# Patient Record
Sex: Female | Born: 1980 | Race: White | Hispanic: No | Marital: Single | Smoking: Former smoker
Health system: Southern US, Community
[De-identification: ages and names within clinical notes are randomized; demographics above are authoritative.]

## PROBLEM LIST (undated history)

## (undated) DIAGNOSIS — E079 Disorder of thyroid, unspecified: Secondary | ICD-10-CM

## (undated) DIAGNOSIS — I10 Essential (primary) hypertension: Secondary | ICD-10-CM

## (undated) DIAGNOSIS — D219 Benign neoplasm of connective and other soft tissue, unspecified: Secondary | ICD-10-CM

## (undated) DIAGNOSIS — N1832 Chronic kidney disease, stage 3b: Secondary | ICD-10-CM

## (undated) DIAGNOSIS — Z8619 Personal history of other infectious and parasitic diseases: Secondary | ICD-10-CM

## (undated) HISTORY — DX: Benign neoplasm of connective and other soft tissue, unspecified: D21.9

## (undated) HISTORY — DX: Disorder of thyroid, unspecified: E07.9

## (undated) HISTORY — PX: THYROID LOBECTOMY: SHX420

## (undated) HISTORY — DX: Essential (primary) hypertension: I10

## (undated) HISTORY — PX: OTHER SURGICAL HISTORY: SHX169

## (undated) HISTORY — DX: Personal history of other infectious and parasitic diseases: Z86.19

---

## 2001-03-24 ENCOUNTER — Other Ambulatory Visit: Admission: RE | Admit: 2001-03-24 | Discharge: 2001-03-24 | Payer: Self-pay | Admitting: Gynecology

## 2001-10-13 ENCOUNTER — Inpatient Hospital Stay (HOSPITAL_COMMUNITY): Admission: AD | Admit: 2001-10-13 | Discharge: 2001-10-15 | Payer: Self-pay | Admitting: Gynecology

## 2001-10-13 ENCOUNTER — Encounter (INDEPENDENT_AMBULATORY_CARE_PROVIDER_SITE_OTHER): Payer: Self-pay

## 2005-02-27 ENCOUNTER — Other Ambulatory Visit: Admission: RE | Admit: 2005-02-27 | Discharge: 2005-02-27 | Payer: Self-pay | Admitting: Gynecology

## 2005-11-24 ENCOUNTER — Ambulatory Visit (HOSPITAL_COMMUNITY): Admission: RE | Admit: 2005-11-24 | Discharge: 2005-11-24 | Payer: Self-pay | Admitting: Family Medicine

## 2006-03-02 ENCOUNTER — Other Ambulatory Visit: Admission: RE | Admit: 2006-03-02 | Discharge: 2006-03-02 | Payer: Self-pay | Admitting: Gynecology

## 2007-03-04 ENCOUNTER — Other Ambulatory Visit: Admission: RE | Admit: 2007-03-04 | Discharge: 2007-03-04 | Payer: Self-pay | Admitting: Gynecology

## 2007-03-26 ENCOUNTER — Ambulatory Visit (HOSPITAL_COMMUNITY): Admission: RE | Admit: 2007-03-26 | Discharge: 2007-03-26 | Payer: Self-pay | Admitting: Gynecology

## 2007-05-06 HISTORY — PX: MYOMECTOMY ABDOMINAL APPROACH: SUR870

## 2007-05-11 ENCOUNTER — Encounter: Payer: Self-pay | Admitting: Gynecology

## 2007-05-12 ENCOUNTER — Inpatient Hospital Stay (HOSPITAL_COMMUNITY): Admission: RE | Admit: 2007-05-12 | Discharge: 2007-05-13 | Payer: Self-pay | Admitting: Gynecology

## 2008-03-07 ENCOUNTER — Other Ambulatory Visit: Admission: RE | Admit: 2008-03-07 | Discharge: 2008-03-07 | Payer: Self-pay | Admitting: Gynecology

## 2008-03-07 ENCOUNTER — Encounter: Payer: Self-pay | Admitting: Gynecology

## 2008-03-07 ENCOUNTER — Ambulatory Visit: Payer: Self-pay | Admitting: Gynecology

## 2009-03-20 ENCOUNTER — Encounter: Payer: Self-pay | Admitting: Gynecology

## 2009-03-20 ENCOUNTER — Other Ambulatory Visit: Admission: RE | Admit: 2009-03-20 | Discharge: 2009-03-20 | Payer: Self-pay | Admitting: Gynecology

## 2009-03-20 ENCOUNTER — Ambulatory Visit: Payer: Self-pay | Admitting: Gynecology

## 2009-03-22 ENCOUNTER — Ambulatory Visit (HOSPITAL_COMMUNITY): Admission: RE | Admit: 2009-03-22 | Discharge: 2009-03-22 | Payer: Self-pay | Admitting: Gynecology

## 2009-05-05 DIAGNOSIS — E079 Disorder of thyroid, unspecified: Secondary | ICD-10-CM

## 2009-05-05 HISTORY — DX: Disorder of thyroid, unspecified: E07.9

## 2009-05-15 ENCOUNTER — Other Ambulatory Visit: Admission: RE | Admit: 2009-05-15 | Discharge: 2009-05-15 | Payer: Self-pay | Admitting: Interventional Radiology

## 2009-05-15 ENCOUNTER — Encounter: Admission: RE | Admit: 2009-05-15 | Discharge: 2009-05-15 | Payer: Self-pay | Admitting: Surgery

## 2009-06-18 ENCOUNTER — Ambulatory Visit (HOSPITAL_COMMUNITY): Admission: RE | Admit: 2009-06-18 | Discharge: 2009-06-19 | Payer: Self-pay | Admitting: Surgery

## 2009-06-18 ENCOUNTER — Encounter (INDEPENDENT_AMBULATORY_CARE_PROVIDER_SITE_OTHER): Payer: Self-pay | Admitting: Surgery

## 2010-03-21 ENCOUNTER — Ambulatory Visit: Payer: Self-pay | Admitting: Gynecology

## 2010-03-21 ENCOUNTER — Other Ambulatory Visit: Admission: RE | Admit: 2010-03-21 | Discharge: 2010-03-21 | Payer: Self-pay | Admitting: Gynecology

## 2010-07-25 LAB — URINALYSIS, ROUTINE W REFLEX MICROSCOPIC
Glucose, UA: NEGATIVE mg/dL
Hgb urine dipstick: NEGATIVE
Ketones, ur: NEGATIVE mg/dL
Nitrite: NEGATIVE
pH: 6.5 (ref 5.0–8.0)

## 2010-07-25 LAB — CBC
Hemoglobin: 12.3 g/dL (ref 12.0–15.0)
MCHC: 34.3 g/dL (ref 30.0–36.0)
MCV: 83.1 fL (ref 78.0–100.0)
WBC: 9.4 10*3/uL (ref 4.0–10.5)

## 2010-07-25 LAB — BASIC METABOLIC PANEL
CO2: 30 mEq/L (ref 19–32)
Glucose, Bld: 91 mg/dL (ref 70–99)
Sodium: 137 mEq/L (ref 135–145)

## 2010-07-25 LAB — PREGNANCY, URINE: Preg Test, Ur: NEGATIVE

## 2010-07-25 LAB — PROTIME-INR: Prothrombin Time: 13.5 seconds (ref 11.6–15.2)

## 2010-07-25 LAB — DIFFERENTIAL
Basophils Relative: 2 % — ABNORMAL HIGH (ref 0–1)
Eosinophils Absolute: 0.1 10*3/uL (ref 0.0–0.7)
Eosinophils Relative: 1 % (ref 0–5)
Lymphocytes Relative: 23 % (ref 12–46)
Monocytes Absolute: 0.3 10*3/uL (ref 0.1–1.0)
Monocytes Relative: 3 % (ref 3–12)
Neutrophils Relative %: 72 % (ref 43–77)

## 2010-09-17 NOTE — Discharge Summary (Signed)
Tanya Perkins, SCHORR           ACCOUNT NO.:  [id number]   MEDICAL RECORD NO.:  [id number]          PATIENT TYPE:  INP   LOCATION:  9307                          FACILITY:  [facility]   PHYSICIAN:  Timothy P. Fontaine, M.D.DATE OF BIRTH:  1980/09/01   DATE OF ADMISSION:  05/11/2007  DATE OF DISCHARGE:  05/13/2007                               DISCHARGE SUMMARY   DISCHARGE DIAGNOSES:  1. Menorrhagia.  2. Dysmenorrhea.  3. Pelvic pressure.  4. Leiomyoma.   PROCEDURE:  Exploratory laparotomy, abdominal myomectomy May 11, 2007.   PATHOLOGY:  [id number] leiomyoma, weight 478 grams.   HOSPITAL COURSE:  This is a 30 year old female with increasing  menorrhagia and dysmenorrhea, pelvic pain, known large myoma underwent  exploratory laparotomy, abdominal myomectomy May 11, 2007.  The  patient's postoperative course was uncomplicated.  She was discharged on  postoperative day #2, ambulating well, tolerating a regular diet,  voiding without difficulty, passing flatus with a postoperative  hemoglobin of 8.  The patient received precautions, instructions and  follow-up. Will be seen in the office 2 weeks after discharge and was  given a prescription for Tylox #30 one to two p.o. every 4-6 hours  p.r.n. pain.      Timothy P. Fontaine, M.D.  Electronically Signed     TPF/MEDQ  D:  05/13/2007  T:  05/13/2007  Job:  [id number]

## 2010-09-17 NOTE — Op Note (Signed)
[name], Tanya Perkins           ACCOUNT NO.:  [id number]   MEDICAL RECORD NO.:  [id number]          PATIENT TYPE:  AMB   LOCATION:  SDC                           [facility]ILITY:  [facility]   PHYSICIAN:  Timothy P. Fontaine, M.D.DATE OF BIRTH:  [birth year]   DATE OF PROCEDURE:  05/11/2007  DATE OF DISCHARGE:                               OPERATIVE REPORT   PREOPERATIVE DIAGNOSES:  1. Leiomyoma.  2. Menorrhagia.  3. Dysmenorrhea.  4. Pelvic pain.   POSTOPERATIVE DIAGNOSES:  1. Leiomyoma.  2. Menorrhagia.  3. Dysmenorrhea.  4. Pelvic pain.   PROCEDURE:  Exploratory laparotomy, myomectomy, chromopertubation.   SURGEON:  Timothy P. Fontaine, M.D.   ASSISTANT:  Rande Brunt. Eda Paschal, M.D.   ANESTHETIC:  General.   COMPLICATIONS:  None.   ESTIMATED BLOOD LOSS:  Less than 100 mL.   SPECIMEN:  Leiomyoma, fresh weight 481 g.   FINDINGS:  EUA:  External BUS vagina grossly normal.  Cervix normal.  Bimanual uterus bulky 16-18 weeks size.  Adnexa without gross masses.  Surgical:  Uterus enlarged with single large myoma posterior uterine  surface.  Pelvic anatomy otherwise noted to be normal with normal  fallopian tubes bilaterally, normal ovaries.  No evidence of pelvic  adhesions or endometriosis.  At the end of the procedure, there was  positive fill and spill from both fallopian tubes.   PROCEDURE:  The patient was taken to the operating room, underwent  general endotracheal anesthesia, was placed in the frog-leg position,  received abdominal perineal vaginal preparation with Betadine solution  per nursing personnel.  The indwelling Foley catheter was placed in  sterile technique.  EUA performed.  Subsequently, a speculum was placed.  Anterior lip of the cervix grasped with a single-tooth tenaculum, and a  pediatric Foley catheter was placed within the endometrial cavity for  the chromopertubation study.  The patient was then placed in the supine  position, draped in the usual fashion  and the abdomen was sharply  entered through a Pfannenstiel incision achieving adequate hemostasis at  all levels.  The uterus was exteriorized with exposure of the large  posterior myoma.  Using a dilution of 20 units per 50 mL of saline  vasopressin, the myoma and surrounding myometrium were injected; a total  of 15 mL used.  Subsequently, a sharp incision was made in the uterine  serosa overlying the myoma, and through sharp and blunt dissection, the  myomectomy was performed.  It was noted the myoma was adjacent to the  endometrial cavity and the bulb from the Foley pediatric catheter could  easily be palpated through the thin overlying endometrium.  The uterus  was closed manually, held in place and the chromopertubation was  performed.  There was easy fill and spill of the right fallopian tube.  There was gradual fill and ultimately spill from the left fallopian  tube.  Several interrupted figure-of-eight 0 Vicryl sutures were placed  in the peri-endometrial area to provide endometrial support, and  subsequently, the balloon was deflated, and the endometrial balloon was  removed.  The myometrium was then closed in progressive figure-of-eight  0 Vicryl suture  layers until the entire wall of the myometrium was  reapproximated to the level of the serosa.  The serosa was then  reapproximated using 3-0 PDS in a running stitch burying the suture  line.  The uterus was irrigated.  There was adequate hemostasis.  Uterus  returned to the abdomen, again irrigated, and hemostasis visualized, and  the anterior peritoneum was then reapproximated using 2-0 Vicryl in a  running stitch.  The fascia was then reapproximated using 0 Vicryl in a  running stitch starting at the angle, meeting in the middle.  The  subcutaneous tissues were irrigated.  Adequate hemostasis achieved with  electrocautery, and the skin was reapproximated using 4-0 Vicryl in a  running subcuticular stitch.  Benzoin and  Steri-Strips applied.  Pressure dressing applied.  The patient awakened without difficulty and  taken to the recovery room in good condition, having tolerated the  procedure well with free-flowing clear yellow urine.      Timothy P. Fontaine, M.D.  Electronically Signed     TPF/MEDQ  D:  05/11/2007  T:  05/11/2007  Job:  [id number]

## 2010-09-17 NOTE — H&P (Signed)
Tanya Perkins, GOEHRING           ACCOUNT NO.:  [id number]   MEDICAL RECORD NO.:  [id number]          PATIENT TYPE:  AMB   LOCATION:  SDC                           [facility]ILITY:  [facility]   PHYSICIAN:  Timothy P. Fontaine, M.D.DATE OF BIRTH:  [birth year]   DATE OF ADMISSION:  05/11/2007  DATE OF DISCHARGE:                              HISTORY & PHYSICAL   CHIEF COMPLAINT:  Menorrhagia, dysmenorrhea, pelvic pressure and pain,  leiomyomata.   HISTORY OF PRESENT ILLNESS:  A 30 year old, G1, P12 female with a history  of increasing menorrhagia, increased pelvic pressure, dysmenorrhea with  known enlarging leiomyoma. The last ultrasound showed a 14 cm posterior  myoma displacing the endometrial cavity.  She had a HSG preoperatively  which showed non opacification of the left fallopian tube, right  fallopian tube patent. She is admitted at this time for myomectomy.   PAST MEDICAL HISTORY:  Uncomplicated.   PAST SURGICAL HISTORY:  None.   ALLERGIES:  None.   CURRENT MEDICATIONS:  None.   REVIEW OF SYSTEMS:  Noncontributory.   FAMILY HISTORY:  Noncontributory.   SOCIAL HISTORY:  Noncontributory.   ADMISSION PHYSICAL EXAM:  VITAL SIGNS:  Afebrile, vital signs are  stable.  HEENT: Normal.  LUNGS:  Clear.  CARDIAC:  Regular rate without rubs, murmurs or gallops.  ABDOMEN:  Benign with palpable mass above the symphysis.  PELVIC:  External BUS, vagina normal.  Cervix normal.  Uterus enlarged,  approximately 18-week size, midline, mobile.  Adnexa without gross  masses.   ASSESSMENT:  A 30 year old, G1, P69 female currently not sexually active  with increasing myoma causing increasing bleeding, pelvic pressure and  pain for abdominal myomectomy.  The proposed surgery was reviewed with  her. Alternatives to include continued expectant management, uterine  artery embolization. Surgeries such as exploratory surgery with  myomectomy versus robotic laparoscopic attempted myomectomy were  reviewed  and given the significance of her symptoms she wants to proceed  with myomectomy and wants an open myomectomy, rejects attempt at  robotics.  Preoperative HSG does show blockage of the left fallopian  tube. She understands that this may or may not unblock once removing the  myoma and no guarantees as far as fertility following the procedure was  made.  She understands due to be healing and scarring there may be  blockage of both fallopian tubes following the procedure or endometrial  cavity defects that would make pregnancy difficult.  The patient also  understands that she will require a cesarean delivery for all subsequent  pregnancies given the size of the myoma and the size of the closure and  that we will further discuss this following the procedure but at least  preoperatively my recommendation would be to proceed with cesarean  delivery for resection.  She also understands she will be at increased  risk for uterine rupture during pregnancies due to scarring on the  uterus and expansion of the uterus all of which she understands and  accepts.  The potential for hysterectomy during the procedure if  complications arise leaving her absolutely irreversibly sterile was also  reviewed, understood and accepted.  The  patient has put 2 units of  autologous blood available. She understands that we may exceed  autologous blood and that we may use heterologous blood and the risks of  transfusion were reviewed to include transfusion reaction, hepatitis,  HIV, mad cow disease and other unknown entities.  The acute risks of the  surgery were reviewed with the patient to include the risks of infection  requiring prolonged antibiotics, abscess formation requiring reoperation  abscess drainage, wound complications requiring opening and draining of  incisions, closure by secondary intention, long-term issues with wounds  to include hernia formation was all discussed, understood and accepted.  The risk  of inadvertent injury to internal organs including bowel,  bladder, ureters, vessels and nerves necessitating major exploratory  reparative surgeries, future reparative surgeries, ostomy formation,  bowel resection was all discussed, understood and accepted.  The patient  also understands that we removed a large myoma that was seen on  ultrasound and that if there are any others that are clearly visible  that we will remove these also but there are no guarantees at removing  all her myomas, that she may have persistence of myomas and/or  development of new myomas following the procedure and that she  understands there are no guarantees as far as future myoma growth and  the need for reoperation or treatment.  The patient's questions were  answered to her satisfaction.  She is ready to proceed with surgery.      Timothy P. Fontaine, M.D.  Electronically Signed     TPF/MEDQ  D:  05/05/2007  T:  05/05/2007  Job:  [id number]

## 2010-09-20 NOTE — Discharge Summary (Signed)
[facility]  Patient:    Tanya Perkins, Tanya Perkins Visit Number: [id number] MRN: [id number]          Service Type: OBS Location: 910A 9133 01 Attending Physician:  Douglass Rivers Dictated by:   Antony Contras, [facility] Admit Date:  10/13/2001 Discharge Date: 10/15/2001                             Discharge Summary  DISCHARGE DIAGNOSES:          1. Post-dates pregnancy.                               2. Oligohydramnios.                               3. Left renal hydronephrosis, grade 2, fetal.                               4. Favorable cervix.  PROCEDURES:                   1. Induction of labor.                               2. Normal spontaneous vaginal delivery of viable                                  infant over intact perineum with repair of                                  second degree laceration.  HISTORY OF PRESENT ILLNESS:   The patient is a 30 year old primigravida with an LMP of January 01, 2001, with an [facility] of October 08, 2001.  Prenatal course was complicated by family history of a sibling dying from congenital heart defect. The patient did have an obstetrical fetal echocardiogram in January 2003, which revealed a normal-appearing heart, no abnormality.  The patient also was treated for bacterial vaginosis during the pregnancy.  Otherwise had a normal pregnancy.  PRENATAL LABORATORY DATA:     Blood type B positive, antibody screen negative. RPR, HBsAG, HIV nonreactive.  GBS was negative.  HOSPITAL COURSE:              The patient was admitted for induction of labor on October 12, 2001, secondary to presenting to the office at 40-4/7 weeks.  At that point she had an AFI which showed decreased amniotic fluid of 6.9 cm, at the 5th percentile, also a fetal left renal hydronephrosis grade 2 was noted. Cervix was favorable at the time of admission.  She did progress to complete dilatation, delivered spontaneously an Apgar 8 and 71 female infant that  weighed 8 pounds 4 ounces over an intact perineum with repair of second degree laceration.  Postpartum course:  She remained afebrile, had no difficulty voiding, was able to be discharged in satisfactory condition on her second postpartum day.  CBC:  Hematocrit 29, hemoglobin 9.5, WBC 17.8, platelets 263.  DISPOSITION:                  Follow  up in six weeks.  Continue prenatal vitamins and iron, Motrin and Tylox for pain. Dictated by:   Antony Contras, [facility] Attending Physician:  Douglass Rivers DD:  11/08/01 TD:  11/10/01 Job: [id number]

## 2010-09-20 NOTE — H&P (Signed)
[facility]  Patient:    Tanya Perkins, Tanya Perkins Visit Number: [id number] MRN: [id number]          Service Type: OBS Location: 910B 9162 01 Attending Physician:  Douglass Rivers Dictated by:   Gaetano Hawthorne. Lily Peer, M.D. Admit Date:  10/13/2001                           History and Physical  CHIEF COMPLAINT:              1. Postdate pregnancy.                               2. Oligohydramnios.                               3. Left renal hydronephrosis, grade 2 (fetal).                               4. Favorable cervix.  HISTORY OF PRESENT ILLNESS:   The patient is a 30 year old, gravida 1, para 0, with an estimated date of confinement of October 08, 2001, currently for 40-4/[redacted] weeks gestation, presented to the office on October 12, 2001, for a routine prenatal visit but, due to the fact that she was going to be postdate, an ultrasound for an AFI and NST was done.  The patients AFI demonstrated decreased amniotic fluid with 6.9 cm at the 5th percentile for 40 weeks. Estimated fetal weight was 7 pounds 14 ounces.  Of note, it was evident that there was a fetal left renal hydronephrosis, grade 2.  The patient had an early scan along with a fetal echocardiogram due to the fact that Arlette had a family history of a sibling dying from congenital heart defect.  The precise type of defect was not known.  The obstetrical and fetal echocardiogram was obtained at [facility] in January of this year, and fetal echocardiogram revealed there was normal-appearing heart, and no other abnormality was noted.  Also, during her prenatal course, she was treated for bacterial vaginosis early in her first trimester, and her GBS culture was negative.  Otherwise, she has done well throughout her pregnancy, and her cervix today in the office was 1-2 cm, 80% effaced, -3 station.  PAST MEDICAL HISTORY:         The patient denies any allergies.  Her brother died at the age of 3 from  congenital cardiac valve abnormality.  She denies any smoking or alcohol consumption.  REVIEW OF SYSTEMS:            See [employer] form.  PHYSICAL EXAMINATION:  VITAL SIGNS:                  Blood pressure 118/70.  Urine negative for protein and glucose.  Weight 162 pounds.  HEENT:                        Unremarkable.  NECK:                         Supple.  Trachea midline.  No carotid bruits, no thyromegaly.  LUNGS:  Clear to auscultation without rhonchi or wheezes.  HEART:                        Regular rate and rhythm without any murmur or gallop.  BREAST:                       Not done.  ABDOMEN:                      Gravid.  Uterus fundal height 37.5 cm.  Vertex presentation by Thayer Ohm maneuver as well as by ultrasound today in the office on October 12, 2001.  PELVIC:                       Cervix 1-2 cm, 80% effaced, -3 station.  EXTREMITIES:                  DTR 1+, negative clonus, trace edema.  PRENATAL LABORATORY DATA:     Blood type was B positive, negative antibody screen. PTR was nonreactive.  Rubella immune.  Hepatitis B surface antigen and HIV were negative.  Pasteur was normal.  Alpha-fetoprotein was normal.  Flush urine screen was normal.  Group B Strep culture was negative.  ASSESSMENT:                   17. A 30 year old, gravida 1, para 0, at 40-4/[redacted]                                  weeks gestation is being admitted for                                  induction secondary to favorable cervix.                               2. Postdate pregnancy.                               3. Oligohydramnios.                               4. Fetal left renal hydronephrosis, grade 2.                               5. The patient will be started on Pitocin and                                  followed by rupture of membranes and                                  placement of scalp electrode into uterine                                  pressure catheter  for close fetal  monitorization.  All of this was explained to                                  the patient.  All questions were answered,                                  and we will follow accordingly.  Of note, her                                  GBS culture was negative  PLAN:                         As per assessment above. Dictated by:   Gaetano Hawthorne Lily Peer, M.D. Attending Physician:  Douglass Rivers DD:  10/12/01 TD:  10/12/01 Job: [id number]

## 2010-12-26 ENCOUNTER — Ambulatory Visit (INDEPENDENT_AMBULATORY_CARE_PROVIDER_SITE_OTHER): Payer: Federal, State, Local not specified - PPO | Admitting: Women's Health

## 2010-12-26 ENCOUNTER — Encounter: Payer: Self-pay | Admitting: Women's Health

## 2010-12-26 VITALS — BP 130/70

## 2010-12-26 DIAGNOSIS — Z113 Encounter for screening for infections with a predominantly sexual mode of transmission: Secondary | ICD-10-CM

## 2010-12-26 DIAGNOSIS — R110 Nausea: Secondary | ICD-10-CM

## 2010-12-26 DIAGNOSIS — E049 Nontoxic goiter, unspecified: Secondary | ICD-10-CM | POA: Insufficient documentation

## 2010-12-26 NOTE — Progress Notes (Signed)
  Presents with a complaint of nausea, and is worried about  pregnancy she had unprotected intercourse  x1 on August 2. She had been on the Ortho Evra patch but stopped it, she has not been sexually active. U PT today is negative. Did review starting back on the Ortho Evra patch. Reviewed slight risk for blood clots strokes she is aware she does have an annual in November and does have a current prescription. We'll start back with her next cycle, did review first month it is not protective, will use condoms. States has been under increased stress lately due to her job. She is applying for a new position, she's currently working nights and not getting adequate rest.  External genitalia is within normal limits, speculum exam cervix is pink without lesion or discharge, GC Chlamydia culture was taken and is pending. Bimanual no CMT or adnexal fullness or tenderness. Declines need for HIV hepatitis and RPR.

## 2011-01-22 LAB — CBC
MCHC: 34.1
MCV: 82.1
MCV: 82.9
Platelets: 481 — ABNORMAL HIGH
RBC: 2.9 — ABNORMAL LOW
RDW: 13.3
RDW: 13.6
WBC: 13.2 — ABNORMAL HIGH

## 2011-01-22 LAB — TYPE AND SCREEN: Antibody Screen: NEGATIVE

## 2011-01-22 LAB — HCG, SERUM, QUALITATIVE: Preg, Serum: NEGATIVE

## 2011-05-16 ENCOUNTER — Encounter: Payer: Self-pay | Admitting: Gynecology

## 2011-05-16 ENCOUNTER — Ambulatory Visit (INDEPENDENT_AMBULATORY_CARE_PROVIDER_SITE_OTHER): Payer: Federal, State, Local not specified - PPO | Admitting: Gynecology

## 2011-05-16 VITALS — BP 120/76 | Ht 63.0 in | Wt 176.0 lb

## 2011-05-16 DIAGNOSIS — N926 Irregular menstruation, unspecified: Secondary | ICD-10-CM

## 2011-05-16 DIAGNOSIS — Z01419 Encounter for gynecological examination (general) (routine) without abnormal findings: Secondary | ICD-10-CM

## 2011-05-16 DIAGNOSIS — Z13220 Encounter for screening for lipoid disorders: Secondary | ICD-10-CM

## 2011-05-16 DIAGNOSIS — Z131 Encounter for screening for diabetes mellitus: Secondary | ICD-10-CM

## 2011-05-16 LAB — URINALYSIS W MICROSCOPIC + REFLEX CULTURE
Glucose, UA: NEGATIVE mg/dL
Hgb urine dipstick: NEGATIVE
Ketones, ur: NEGATIVE mg/dL
Leukocytes, UA: NEGATIVE
Urobilinogen, UA: 0.2 mg/dL (ref 0.0–1.0)
pH: 7.5 (ref 5.0–8.0)

## 2011-05-16 MED ORDER — NORELGESTROMIN-ETH ESTRADIOL 150-35 MCG/24HR TD PTWK: 1.0000 | MEDICATED_PATCH | TRANSDERMAL | Status: DC

## 2011-05-16 NOTE — Patient Instructions (Signed)
Follow up for hormone studies. If normal then we'll plan on progesterone withdrawal to bring on menses and then you will start Ortho Evra patches. If any of the labs are abnormal but will discuss this as to where to go from there.

## 2011-05-16 NOTE — Progress Notes (Signed)
Tanya Perkins 06-10-1980 [id number]        31 y.o.  for annual exam.  Had been on Ortho Evra patch doing well but then stopped in November when she ran out, had her period then and has not had a period since then. No other symptoms. She does want to restart the Ortho Evra patch.  Past medical history,surgical history, medications, allergies, family history and social history were all reviewed and documented in the EPIC chart. ROS:  Was performed and pertinent positives and negatives are included in the history.  Exam: Sherrilyn Rist chaperone present Filed Vitals:   05/16/11 1018  BP: 120/76   General appearance  Normal Skin grossly normal Head/Neck normal with no cervical or supraclavicular adenopathy thyroid normal Lungs  clear Cardiac RR, without RMG Abdominal  soft, nontender, without masses, organomegaly or hernia Breasts  examined lying and sitting without masses, retractions, discharge or axillary adenopathy. Pelvic  Ext/BUS/vagina  normal   Cervix  normal    Uterus  anteverted, normal size, shape and contour, midline and mobile nontender   Adnexa  Without masses or tenderness    Anus and perineum  normal   Rectovaginal  normal sphincter tone without palpated masses or tenderness.    Assessment/Plan:  31 y.o. female for annual exam.    1. Missed menses. Discussed various possibilities. Will check baseline labs to include qualitative hCG, TSH, FSH, prolactin. Patient will follow up for results. Assuming negative we'll plan Provera withdrawal 10 mg twice a day x5 days and then restart Ortho Evra at her choice. 2. Contraception. I refilled her Ortho Evra times a year. She does know to wait until she hears from [place] about the labs and then we'll plan on the progesterone withdrawal and then restarting. 3. Breast self SBE monthly reviewed plan mammogram closer to 40. 4. Pap smear. No Pap smear was done today. She has no history of abnormal Pap smears in the past with numerous normal reports  in her chart. Her last Pap smear was 2011. I discussed current screening guidelines we'll plan on an every 3 year screen. 5. Health maintenance. Will check baseline CBC glucose lipid profile and urinalysis along with her above hormone studies.    Dara Lords MD, 10:41 AM 05/16/2011

## 2011-05-17 LAB — FOLLICLE STIMULATING HORMONE: FSH: 5 m[IU]/mL

## 2011-05-17 LAB — CBC WITH DIFFERENTIAL/PLATELET
Basophils Relative: 0 % (ref 0–1)
Eosinophils Absolute: 0.1 10*3/uL (ref 0.0–0.7)
Eosinophils Relative: 1 % (ref 0–5)
Lymphs Abs: 1.9 10*3/uL (ref 0.7–4.0)
MCH: 25.3 pg — ABNORMAL LOW (ref 26.0–34.0)
Monocytes Absolute: 0.3 10*3/uL (ref 0.1–1.0)
Monocytes Relative: 4 % (ref 3–12)
Neutro Abs: 5.3 10*3/uL (ref 1.7–7.7)
Platelets: 414 10*3/uL — ABNORMAL HIGH (ref 150–400)
RBC: 3.99 MIL/uL (ref 3.87–5.11)

## 2011-05-17 LAB — PROLACTIN: Prolactin: 8.4 ng/mL

## 2011-05-17 LAB — GLUCOSE, RANDOM: Glucose, Bld: 76 mg/dL (ref 70–99)

## 2011-05-17 LAB — LIPID PANEL
LDL Cholesterol: 73 mg/dL (ref 0–99)
Total CHOL/HDL Ratio: 2.5 Ratio
Triglycerides: 67 mg/dL (ref <150)
VLDL: 13 mg/dL (ref 0–40)

## 2011-05-19 ENCOUNTER — Other Ambulatory Visit: Payer: Self-pay | Admitting: *Deleted

## 2011-05-19 DIAGNOSIS — D649 Anemia, unspecified: Secondary | ICD-10-CM

## 2011-05-19 MED ORDER — MEDROXYPROGESTERONE ACETATE 10 MG PO TABS: 10.0000 mg | ORAL_TABLET | Freq: Two times a day (BID) | ORAL | Status: DC

## 2011-05-19 NOTE — Progress Notes (Signed)
Addended by: Dara Lords on: 05/19/2011 08:07 AM   Modules accepted: Orders

## 2011-06-02 ENCOUNTER — Telehealth: Payer: Self-pay | Admitting: *Deleted

## 2011-06-02 ENCOUNTER — Other Ambulatory Visit: Payer: Federal, State, Local not specified - PPO

## 2011-06-02 DIAGNOSIS — N912 Amenorrhea, unspecified: Secondary | ICD-10-CM

## 2011-06-02 DIAGNOSIS — D649 Anemia, unspecified: Secondary | ICD-10-CM

## 2011-06-02 LAB — CBC WITH DIFFERENTIAL/PLATELET
Eosinophils Relative: 1 % (ref 0–5)
HCT: 34.1 % — ABNORMAL LOW (ref 36.0–46.0)
Lymphocytes Relative: 24 % (ref 12–46)
Lymphs Abs: 2.4 10*3/uL (ref 0.7–4.0)
MCV: 81 fL (ref 78.0–100.0)
Neutro Abs: 7 10*3/uL (ref 1.7–7.7)
Platelets: 492 10*3/uL — ABNORMAL HIGH (ref 150–400)
RBC: 4.21 MIL/uL (ref 3.87–5.11)
WBC: 9.9 10*3/uL (ref 4.0–10.5)

## 2011-06-02 NOTE — Telephone Encounter (Signed)
Pt informed with the below note, transferred to appointment desk 

## 2011-06-02 NOTE — Telephone Encounter (Signed)
Lm for pt to call

## 2011-06-02 NOTE — Telephone Encounter (Signed)
Recheck hCG. Order was placed. Assuming negative then we'll plan on starting Ortho Evra as she may require some estrogen to have a period

## 2011-06-02 NOTE — Telephone Encounter (Signed)
Pt had office visit on 1//11/13 for no period in December. Pt was given provera 10 mg twice a day for 5 days and was told to start patch ortho evra once period starts. Pt said no period yet? Pt would like to know what should she do? Please advise

## 2011-06-03 ENCOUNTER — Other Ambulatory Visit: Payer: Self-pay | Admitting: *Deleted

## 2011-06-03 DIAGNOSIS — D649 Anemia, unspecified: Secondary | ICD-10-CM

## 2012-05-28 ENCOUNTER — Encounter: Payer: Federal, State, Local not specified - PPO | Admitting: Gynecology

## 2012-06-18 ENCOUNTER — Encounter: Payer: Federal, State, Local not specified - PPO | Admitting: Gynecology

## 2012-08-24 ENCOUNTER — Encounter: Payer: Self-pay | Admitting: Gynecology

## 2012-09-02 ENCOUNTER — Encounter: Payer: Self-pay | Admitting: Women's Health

## 2012-09-02 ENCOUNTER — Other Ambulatory Visit (HOSPITAL_COMMUNITY)
Admission: RE | Admit: 2012-09-02 | Discharge: 2012-09-02 | Disposition: A | Discharge status: Home / Self Care | Payer: Federal, State, Local not specified - PPO | Source: Ambulatory Visit | Attending: Gynecology | Admitting: Gynecology

## 2012-09-02 ENCOUNTER — Encounter: Payer: Self-pay | Admitting: Gynecology

## 2012-09-02 ENCOUNTER — Ambulatory Visit (INDEPENDENT_AMBULATORY_CARE_PROVIDER_SITE_OTHER): Payer: Federal, State, Local not specified - PPO | Admitting: Gynecology

## 2012-09-02 VITALS — BP 120/76 | Ht 64.0 in | Wt 186.0 lb

## 2012-09-02 DIAGNOSIS — Z1151 Encounter for screening for human papillomavirus (HPV): Secondary | ICD-10-CM | POA: Insufficient documentation

## 2012-09-02 DIAGNOSIS — Z13220 Encounter for screening for lipoid disorders: Secondary | ICD-10-CM

## 2012-09-02 DIAGNOSIS — Z01419 Encounter for gynecological examination (general) (routine) without abnormal findings: Secondary | ICD-10-CM | POA: Insufficient documentation

## 2012-09-02 DIAGNOSIS — R635 Abnormal weight gain: Secondary | ICD-10-CM

## 2012-09-02 LAB — CBC WITH DIFFERENTIAL/PLATELET
Hemoglobin: 12.3 g/dL (ref 12.0–15.0)
Lymphocytes Relative: 24 % (ref 12–46)
Lymphs Abs: 2.3 10*3/uL (ref 0.7–4.0)
MCH: 25.9 pg — ABNORMAL LOW (ref 26.0–34.0)
Monocytes Relative: 5 % (ref 3–12)
Neutro Abs: 6.8 10*3/uL (ref 1.7–7.7)
Neutrophils Relative %: 70 % (ref 43–77)
RBC: 4.75 MIL/uL (ref 3.87–5.11)
WBC: 9.6 10*3/uL (ref 4.0–10.5)

## 2012-09-02 LAB — COMPREHENSIVE METABOLIC PANEL
ALT: 19 U/L (ref 0–35)
Albumin: 4.2 g/dL (ref 3.5–5.2)
CO2: 30 mEq/L (ref 19–32)
Chloride: 99 mEq/L (ref 96–112)
Glucose, Bld: 79 mg/dL (ref 70–99)
Potassium: 4.2 mEq/L (ref 3.5–5.3)
Sodium: 135 mEq/L (ref 135–145)
Total Protein: 7.2 g/dL (ref 6.0–8.3)

## 2012-09-02 LAB — LIPID PANEL
HDL: 53 mg/dL (ref >39)
LDL Cholesterol: 82 mg/dL (ref 0–99)
Total CHOL/HDL Ratio: 2.9 Ratio
Triglycerides: 91 mg/dL (ref <150)
VLDL: 18 mg/dL (ref 0–40)

## 2012-09-02 LAB — TSH: TSH: 1.372 u[IU]/mL (ref 0.350–4.500)

## 2012-09-02 NOTE — Addendum Note (Signed)
Addended by: Dayna Barker on: 09/02/2012 11:47 AM   Modules accepted: Orders

## 2012-09-02 NOTE — Progress Notes (Signed)
Tanya Perkins Jun 29, 1980 [id number]        32 y.o.  G1P1001 for annual exam.  Doing well. Does note some weight gain in the past year.   Past medical history,surgical history, medications, allergies, family history and social history were all reviewed and documented in the EPIC chart. ROS:  Was performed and pertinent positives and negatives are included in the history.  Exam: Kim assistant Filed Vitals:   09/02/12 1100  BP: 120/76  Height: 5\' 4"  (1.626 m)  Weight: 186 lb (84.369 kg)   General appearance  Normal Skin grossly normal Head/Neck normal with no cervical or supraclavicular adenopathy thyroid normal Lungs  clear Cardiac RR, without RMG Abdominal  soft, nontender, without masses, organomegaly or hernia Breasts  examined lying and sitting without masses, retractions, discharge or axillary adenopathy. Pelvic  Ext/BUS/vagina  normal   Cervix  normal Pap/HPV  Uterus  anteverted, normal size, shape and contour, midline and mobile nontender   Adnexa  Without masses or tenderness    Anus and perineum  normal   Rectovaginal  normal sphincter tone without palpated masses or tenderness.    Assessment/Plan:  32 y.o. G16P1001 female for annual exam.   1. Contraception. Patient was on Ortho Evra but discontinued this as she is not sexually active. Does not want to be on anything at this point but will call if she wants to restart contraception. 2. Weight gain. I suspect diet/exercise related. Will check baseline TSH comprehensive metabolic panel. 3. Anemia. History of hemoglobin of 10. We'll check CBC today. 4. Breast health. SBE monthly review. We'll plan mammogram closer to 40. 5. Pap smear 2011. Pap/HPV done today. No history of abnormal Pap smears. Plan 5 year screening if normal.   6. Health maintenance. CBC lipid profile TSH urinalysis comprehensive metabolic panel ordered. Followup one year, sooner as needed   Dara Lords MD, 11:37 AM 09/02/2012

## 2012-09-02 NOTE — Patient Instructions (Addendum)
Follow up for annual exam in one year 

## 2012-09-03 LAB — URINALYSIS W MICROSCOPIC + REFLEX CULTURE
Bacteria, UA: NONE SEEN
Bilirubin Urine: NEGATIVE
Crystals: NONE SEEN
Hgb urine dipstick: NEGATIVE
Ketones, ur: NEGATIVE mg/dL
Nitrite: NEGATIVE
Specific Gravity, Urine: 1.019 (ref 1.005–1.030)
Urobilinogen, UA: 0.2 mg/dL (ref 0.0–1.0)

## 2012-12-16 ENCOUNTER — Encounter: Payer: Self-pay | Admitting: *Deleted

## 2012-12-16 NOTE — Progress Notes (Signed)
Patient ID: Tanya Perkins, female   DOB: [birth year], 32 y.o.   MRN: [id number] Victorino Dike from Dr.kerr office called stating pt called requesting referral. I called pt back and left her message regarding this, nonething was in chart regarding this.

## 2012-12-20 ENCOUNTER — Telehealth: Payer: Self-pay | Admitting: *Deleted

## 2012-12-20 NOTE — Telephone Encounter (Signed)
Okay for referral.  Am not exactly sure in reference to what others and I think it may be weight loss.

## 2012-12-20 NOTE — Telephone Encounter (Signed)
Notes faxed to office,they will contact pt with time and date.

## 2012-12-20 NOTE — Telephone Encounter (Signed)
Pt was seen on 09/02/12 for annual, pt said that you told her if she found a endocrinologist to let you know and referral would be given? Patient would like to see Dr. Sharl Ma. Okay to place referral?

## 2013-01-04 NOTE — Telephone Encounter (Signed)
Appt. 02/24/13 @ 12:20 pm with Dr.Kerr. Pt is aware of this time and date.

## 2014-03-06 ENCOUNTER — Encounter: Payer: Self-pay | Admitting: Gynecology

## 2014-07-03 ENCOUNTER — Encounter: Payer: Self-pay | Admitting: Family

## 2014-07-03 ENCOUNTER — Ambulatory Visit (INDEPENDENT_AMBULATORY_CARE_PROVIDER_SITE_OTHER): Payer: Federal, State, Local not specified - PPO | Admitting: Family

## 2014-07-03 VITALS — BP 136/84 | HR 96 | Temp 98.1°F | Resp 18 | Ht 64.0 in | Wt 195.8 lb

## 2014-07-03 DIAGNOSIS — E049 Nontoxic goiter, unspecified: Secondary | ICD-10-CM

## 2014-07-03 NOTE — Assessment & Plan Note (Signed)
R thyroid fullness. Obtain thyroid US to further evaluate. Will plan to check TSH with her upcoming cpx.

## 2014-07-03 NOTE — Progress Notes (Signed)
Subjective:    Patient ID: Tanya Perkins, female    DOB: 30-Nov-1980, 34 y.o.   MRN: [id number]  HPI  [name] is 34 yr old female who presents today to establish care.   Thyroid mass-Pt has hx of non-malignant, partial thyroidectomy. tsh was checked 12/14- wnl per pt- checked by gyn.  Reports TSH has been stable without synthroid.  Notes that her weight can fluctuate up and down.      Review of Systems  Constitutional:       Variable weight  HENT: Negative for hearing loss and rhinorrhea.   Eyes: Negative for visual disturbance.  Respiratory: Negative for cough.   Cardiovascular: Negative for leg swelling.  Gastrointestinal: Negative for diarrhea and constipation.  Genitourinary: Negative for dysuria and frequency.       Some dysmenorrhea  Musculoskeletal: Negative for myalgias and arthralgias.  Skin: Negative for rash.  Neurological:       Reports occasional mild headaches, rarely bad  Hematological: Negative for adenopathy.  Psychiatric/Behavioral:       Denies depression/anxiety       Past Medical History  Diagnosis Date  . 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 Fibroid     uterine  . History of chicken pox     History   Social History  . Marital Status: Single    Spouse Name: N/A  . Number of Children: N/A  . Years of Education: N/A   Occupational History  . Not on file.   Social History Main Topics  . Smoking status: Former Research scientist (life sciences)  . Smokeless tobacco: Never Used  . Alcohol Use: Yes     Comment: Rare  . Drug Use: No  . Sexual Activity:    Partners: Male    Birth Control/ Protection: None   Other Topics Concern  . 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 Myomectomy abdominal approach  05/2007  . Thyroid lobectomy      LEFT      Family History  Problem Relation Age of Onset  . Hypertension Brother   . Hyperlipidemia Brother   . Diabetes Maternal Aunt   . Hypertension Maternal Aunt   . Diabetes Maternal Uncle   . Hypertension Maternal Uncle   . Diabetes Maternal Grandmother   . Stroke Maternal Grandmother   . Breast cancer Paternal Grandmother     Age 45's  . Diabetes Cousin   . Asthma Cousin   . Hypertension Father   . Hyperlipidemia Father   . Hypertension Paternal Aunt   . Hypertension Paternal Uncle   . Stroke Paternal Grandfather   . Hypertension Paternal Grandfather   . Thyroid disease Mother     ?due to accident  . Leukemia Maternal Grandfather   . Cancer Maternal Grandfather     lung  . Hypertension Maternal Grandfather     No Known Allergies  Current Outpatient Prescriptions on File Prior to Visit  Medication Sig Dispense Refill  . Multiple Vitamin (MULTIVITAMIN) tablet Take 1 tablet by mouth daily.     No current facility-administered medications on file prior to visit.    BP 136/84 mmHg  Pulse 96  Temp(Src) 98.1 F (36.7 C) (Oral)  Resp 18  Ht 5\' 4"  (1.626 m)  Wt 195 lb 12.8 oz (88.814 kg)  BMI 33.59 kg/m2  SpO2 100%  LMP 06/13/2014    Objective:   Physical Exam  Constitutional: She is oriented to person, place, and time. She appears well-developed and well-nourished.  HENT:  Right Ear: Tympanic membrane and ear canal normal.  Left Ear: Tympanic membrane and ear canal normal.  Neck:  R thyroid fullness, L thyroid lobe surgically absent.   Cardiovascular: Normal rate, regular rhythm and normal heart sounds.   No murmur heard. Pulmonary/Chest: Effort normal and breath sounds normal. No respiratory distress. She has no wheezes.  Musculoskeletal: She exhibits no edema.  Lymphadenopathy:    She has no cervical adenopathy.  Neurological: She is alert and oriented to person, place, and time.  Skin: Skin is warm and dry.  Psychiatric: She has a normal mood and affect.  Her behavior is normal. Judgment and thought content normal.          Assessment & Plan:

## 2014-07-03 NOTE — Patient Instructions (Signed)
You will be contacted about your thyroid ultrasound. Please schedule fasting physical at the front desk.   Welcome to [facility]!

## 2014-07-03 NOTE — Progress Notes (Signed)
Pre visit review using our clinic review tool, if applicable. No additional management support is needed unless otherwise documented below in the visit note. 

## 2014-07-05 ENCOUNTER — Ambulatory Visit (HOSPITAL_BASED_OUTPATIENT_CLINIC_OR_DEPARTMENT_OTHER)
Admission: RE | Admit: 2014-07-05 | Discharge: 2014-07-05 | Disposition: A | Discharge status: Home / Self Care | Payer: Federal, State, Local not specified - PPO | Source: Ambulatory Visit | Attending: Family | Admitting: Family

## 2014-07-05 DIAGNOSIS — E042 Nontoxic multinodular goiter: Secondary | ICD-10-CM | POA: Insufficient documentation

## 2014-07-05 DIAGNOSIS — E010 Iodine-deficiency related diffuse (endemic) goiter: Secondary | ICD-10-CM | POA: Diagnosis present

## 2014-07-05 DIAGNOSIS — E049 Nontoxic goiter, unspecified: Secondary | ICD-10-CM

## 2014-07-06 ENCOUNTER — Encounter: Payer: Self-pay | Admitting: Family

## 2014-07-20 ENCOUNTER — Telehealth: Payer: Self-pay | Admitting: Family

## 2014-07-20 NOTE — Telephone Encounter (Signed)
Pre visit letter sent  °

## 2014-08-09 ENCOUNTER — Encounter: Payer: Self-pay | Admitting: Family

## 2014-08-09 ENCOUNTER — Ambulatory Visit (INDEPENDENT_AMBULATORY_CARE_PROVIDER_SITE_OTHER): Payer: Federal, State, Local not specified - PPO | Admitting: Family

## 2014-08-09 VITALS — BP 118/80 | HR 81 | Temp 98.2°F | Resp 16 | Ht 64.0 in | Wt 194.0 lb

## 2014-08-09 DIAGNOSIS — Z23 Encounter for immunization: Secondary | ICD-10-CM

## 2014-08-09 DIAGNOSIS — Z0000 Encounter for general adult medical examination without abnormal findings: Secondary | ICD-10-CM | POA: Insufficient documentation

## 2014-08-09 LAB — CBC WITH DIFFERENTIAL/PLATELET
BASOS ABS: 0.1 10*3/uL (ref 0.0–0.1)
Basophils Relative: 1.2 % (ref 0.0–3.0)
EOS ABS: 0.6 10*3/uL (ref 0.0–0.7)
Eosinophils Relative: 0.6 % (ref 0.0–5.0)
HEMATOCRIT: 39.7 % (ref 36.0–46.0)
Hemoglobin: 13.2 g/dL (ref 12.0–15.0)
LYMPHS ABS: 2.3 10*3/uL (ref 0.7–4.0)
Lymphocytes Relative: 21.4 % (ref 12.0–46.0)
MCHC: 33.3 g/dL (ref 30.0–36.0)
MCV: 82.5 fl (ref 78.0–100.0)
MONO ABS: 0.3 10*3/uL (ref 0.1–1.0)
Monocytes Relative: 3.1 % (ref 3.0–12.0)
Neutro Abs: 7.8 10*3/uL — ABNORMAL HIGH (ref 1.4–7.7)
Neutrophils Relative %: 73.7 % (ref 43.0–77.0)
PLATELETS: 426 10*3/uL — AB (ref 150.0–400.0)
RBC: 4.82 Mil/uL (ref 3.87–5.11)
RDW: 14 % (ref 11.5–15.5)
WBC: 10.7 10*3/uL — ABNORMAL HIGH (ref 4.0–10.5)

## 2014-08-09 LAB — BASIC METABOLIC PANEL
BUN: 9 mg/dL (ref 6–23)
CALCIUM: 9.4 mg/dL (ref 8.4–10.5)
CO2: 27 mEq/L (ref 19–32)
Chloride: 100 mEq/L (ref 96–112)
Creatinine, Ser: 0.73 mg/dL (ref 0.40–1.20)
GFR: 96.45 mL/min (ref >60.00)
Glucose, Bld: 83 mg/dL (ref 70–99)
POTASSIUM: 3.5 meq/L (ref 3.5–5.1)
Sodium: 135 mEq/L (ref 135–145)

## 2014-08-09 LAB — URINALYSIS, ROUTINE W REFLEX MICROSCOPIC
Bilirubin Urine: NEGATIVE
Leukocytes, UA: NEGATIVE
Nitrite: NEGATIVE
Specific Gravity, Urine: >=1.03 — AB (ref 1.000–1.030)
TOTAL PROTEIN, URINE-UPE24: NEGATIVE
UROBILINOGEN UA: 0.2 (ref 0.0–1.0)
Urine Glucose: NEGATIVE
pH: 6 (ref 5.0–8.0)

## 2014-08-09 LAB — LIPID PANEL
Cholesterol: 158 mg/dL (ref 0–200)
HDL: 45.8 mg/dL (ref >39.00)
LDL Cholesterol: 99 mg/dL (ref 0–99)
NONHDL: 112.2
Total CHOL/HDL Ratio: 3
Triglycerides: 64 mg/dL (ref 0.0–149.0)
VLDL: 12.8 mg/dL (ref 0.0–40.0)

## 2014-08-09 LAB — HEPATIC FUNCTION PANEL
ALT: 18 U/L (ref 0–35)
AST: 18 U/L (ref 0–37)
Albumin: 4.2 g/dL (ref 3.5–5.2)
Alkaline Phosphatase: 75 U/L (ref 39–117)
BILIRUBIN DIRECT: 0.1 mg/dL (ref 0.0–0.3)
Total Bilirubin: 0.5 mg/dL (ref 0.2–1.2)
Total Protein: 7.9 g/dL (ref 6.0–8.3)

## 2014-08-09 LAB — TSH: TSH: 3.45 u[IU]/mL (ref 0.35–4.50)

## 2014-08-09 NOTE — Assessment & Plan Note (Signed)
Discussed diet, exercise, weight loss. Obtain routine lab work. Pap up to date. tdap today.

## 2014-08-09 NOTE — Progress Notes (Signed)
Pre visit review using our clinic review tool, if applicable. No additional management support is needed unless otherwise documented below in the visit note. 

## 2014-08-09 NOTE — Patient Instructions (Addendum)
Please complete lab work prior to leaving. Follow up in 1 year for annual physical and follow up thyroid ultrasound.  Continue your work with healthy diet, exercise, weight loss.

## 2014-08-09 NOTE — Progress Notes (Signed)
Subjective:    Patient ID: Tanya Perkins, female    DOB: 04/22/1981, 34 y.o.   MRN: [id number]  HPI  Ms. Tanya Perkins is a 34 yr old female who presents today for cpx.  Patient presents today for complete physical.  Immunizations: tetanus up to date Diet: reports healthy diet Exercise: yes- walks/runs 5 miles every other day Pap Smear:  2014- Dr. Phineas Real [facility]YN Dental: January 2016 Eye:  Due last was 1/15   Review of Systems  Constitutional: Negative for unexpected weight change.  HENT: Negative for hearing loss and rhinorrhea.   Eyes: Negative for visual disturbance.  Respiratory: Negative for cough and shortness of breath.   Cardiovascular: Negative for leg swelling.  Gastrointestinal: Negative for nausea, diarrhea and constipation.  Genitourinary: Negative for dysuria, frequency and hematuria.  Musculoskeletal: Negative for myalgias and arthralgias.  Skin: Negative for rash.  Neurological:       Occasional headaches due to allergies and stress  Hematological: Negative for adenopathy.  Psychiatric/Behavioral: Negative for dysphoric mood and agitation.   Past Medical History  Diagnosis Date  . 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 Fibroid     uterine  . History of chicken pox     History   Social History  . Marital Status: Single    Spouse Name: N/A  . Number of Children: N/A  . Years of Education: N/A   Occupational History  . Not on file.   Social History Main Topics  . Smoking status: Former Research scientist (life sciences)  . Smokeless tobacco: Never Used  . Alcohol Use: Yes     Comment: Rare  . Drug Use: No  . Sexual Activity:    Partners: Male    Birth Control/ Protection: None   Other Topics Concern  . 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 Myomectomy abdominal approach  05/2007  . Thyroid lobectomy      LEFT    Family History  Problem Relation Age of Onset  . Hypertension Brother   . Hyperlipidemia Brother   . Diabetes Maternal Aunt   . Hypertension Maternal Aunt   . Diabetes Maternal Uncle   . Hypertension Maternal Uncle   . Diabetes Maternal Grandmother   . Stroke Maternal Grandmother   . Breast cancer Paternal Grandmother     Age 12's  . Diabetes Cousin   . Asthma Cousin   . Hypertension Father   . Hyperlipidemia Father   . Hypertension Paternal Aunt   . Hypertension Paternal Uncle   . Stroke Paternal Grandfather   . Hypertension Paternal Grandfather   . Thyroid disease Mother     ?due to accident  . Leukemia Maternal Grandfather   . Cancer Maternal Grandfather     lung  . Hypertension Maternal Grandfather     No Known Allergies  Current Outpatient Prescriptions on File Prior to Visit  Medication Sig Dispense Refill  . Multiple Vitamin (MULTIVITAMIN) tablet Take 1 tablet by mouth daily.    . Omega-3 Fatty Acids (FISH OIL) 1000 MG CAPS Take 1 capsule by mouth every other day.     No current facility-administered medications on file prior to visit.    BP 118/80 mmHg  Pulse 81  Temp(Src) 98.2  F (36.8 C) (Oral)  Resp 16  Ht 5\' 4"  (1.626 m)  Wt 194 lb (87.998 kg)  BMI 33.28 kg/m2  SpO2 99%  LMP 07/09/2014       Objective:   Physical Exam Physical Exam  Constitutional: She is oriented to person, place, and time. She appears well-developed and well-nourished. No distress.  HENT:  Head: Normocephalic and atraumatic.  Right Ear: Tympanic membrane and ear canal normal.  Left Ear: Tympanic membrane and ear canal normal.  Mouth/Throat: Oropharynx is clear and moist.  Eyes: Pupils are equal, round, and reactive to light. No scleral icterus.  Neck: Normal range of motion. Mild right thyroid fullness noted. l thyroid lobe surgically absent  Cardiovascular: Normal rate and  regular rhythm.   No murmur heard. Pulmonary/Chest: Effort normal and breath sounds normal. No respiratory distress. He has no wheezes. She has no rales. She exhibits no tenderness.  Abdominal: Soft. Bowel sounds are normal. He exhibits no distension and no mass. There is no tenderness. There is no rebound and no guarding.  Musculoskeletal: She exhibits no edema.  Lymphadenopathy:    She has no cervical adenopathy.  Neurological: She is alert and oriented to person, place, and time. She has normal patellar reflexes. She exhibits normal muscle tone. Coordination normal.  Skin: Skin is warm and dry.  Psychiatric: She has a normal mood and affect. Her behavior is normal. Judgment and thought content normal.  Breasts: Examined lying Right: Without masses, retractions, discharge or axillary adenopathy.  Left: Without masses, retractions, discharge or axillary adenopathy.          Assessment & Plan:          Assessment & Plan:

## 2014-08-10 ENCOUNTER — Encounter: Payer: Self-pay | Admitting: Family

## 2015-01-04 ENCOUNTER — Encounter: Payer: Self-pay | Admitting: Family

## 2015-01-04 DIAGNOSIS — Z30011 Encounter for initial prescription of contraceptive pills: Secondary | ICD-10-CM

## 2015-01-04 NOTE — Telephone Encounter (Signed)
Tanya Perkins, We had discussed a birth control method that would help regulate my periods. I can come in for a visit if you prefer again, I would like to go back on the O

## 2015-01-04 NOTE — Telephone Encounter (Signed)
Please advise if you would like me to schedule an office visit to discuss the birth control options?

## 2015-01-05 NOTE — Telephone Encounter (Signed)
Pt has lab appt 01/09/15.

## 2015-01-09 ENCOUNTER — Other Ambulatory Visit (INDEPENDENT_AMBULATORY_CARE_PROVIDER_SITE_OTHER): Payer: Federal, State, Local not specified - PPO

## 2015-01-09 ENCOUNTER — Encounter: Payer: Self-pay | Admitting: Family

## 2015-01-09 DIAGNOSIS — Z30011 Encounter for initial prescription of contraceptive pills: Secondary | ICD-10-CM | POA: Diagnosis not present

## 2015-01-10 ENCOUNTER — Telehealth: Payer: Self-pay | Admitting: Family

## 2015-01-10 ENCOUNTER — Other Ambulatory Visit: Payer: Self-pay | Admitting: Family

## 2015-01-10 LAB — PREGNANCY, URINE: Preg Test, Ur: NEGATIVE

## 2015-01-10 MED ORDER — NORELGESTROMIN-ETH ESTRADIOL 150-35 MCG/24HR TD PTWK: 1.0000 | MEDICATED_PATCH | TRANSDERMAL | Status: DC

## 2015-01-10 NOTE — Telephone Encounter (Signed)
See mychart.  

## 2015-06-14 NOTE — Telephone Encounter (Signed)
October 4th, 2017 at work given my [facility]

## 2015-06-18 NOTE — Telephone Encounter (Signed)
Health Maintenance has been updated. 

## 2015-09-25 ENCOUNTER — Telehealth: Payer: Self-pay | Admitting: Family

## 2015-09-25 DIAGNOSIS — E041 Nontoxic single thyroid nodule: Secondary | ICD-10-CM

## 2015-09-25 NOTE — Telephone Encounter (Signed)
Attempted to reach pt and left message for her to check mychart message. Message sent.

## 2015-09-25 NOTE — Telephone Encounter (Signed)
Please let pt know that I reviewed her chart and she is due for follow up thyroid US to follow up on thyroid nodules. I have pended below. She is also due for cpx.

## 2015-10-03 ENCOUNTER — Ambulatory Visit: Payer: Self-pay | Admitting: Family

## 2015-10-05 ENCOUNTER — Ambulatory Visit (HOSPITAL_BASED_OUTPATIENT_CLINIC_OR_DEPARTMENT_OTHER): Payer: Federal, State, Local not specified - PPO

## 2015-10-09 ENCOUNTER — Telehealth: Payer: Self-pay | Admitting: Family

## 2015-10-09 ENCOUNTER — Ambulatory Visit: Payer: Self-pay | Admitting: Family

## 2015-10-09 DIAGNOSIS — Z0289 Encounter for other administrative examinations: Secondary | ICD-10-CM

## 2015-10-10 ENCOUNTER — Encounter: Payer: Self-pay | Admitting: Family

## 2015-10-10 NOTE — Telephone Encounter (Signed)
Pt was no show 10/09/15 for mychart OV, pt has not rescheduled, 1st no show + 1 cancellation w/in 12 months, charge or no charge?

## 2015-10-10 NOTE — Telephone Encounter (Signed)
Yes please

## 2015-10-10 NOTE — Telephone Encounter (Signed)
Marked to charge and mailing no show letter °

## 2016-01-28 ENCOUNTER — Encounter: Payer: Self-pay | Admitting: Family

## 2016-02-25 ENCOUNTER — Ambulatory Visit (INDEPENDENT_AMBULATORY_CARE_PROVIDER_SITE_OTHER): Payer: Federal, State, Local not specified - PPO | Admitting: Family

## 2016-02-25 ENCOUNTER — Other Ambulatory Visit (HOSPITAL_COMMUNITY)
Admission: RE | Admit: 2016-02-25 | Discharge: 2016-02-25 | Disposition: A | Discharge status: Home / Self Care | Payer: Federal, State, Local not specified - PPO | Source: Ambulatory Visit | Attending: Family | Admitting: Family

## 2016-02-25 ENCOUNTER — Encounter: Payer: Self-pay | Admitting: Family

## 2016-02-25 VITALS — BP 139/70 | HR 73 | Temp 97.8°F | Resp 16 | Ht 63.6 in | Wt 189.6 lb

## 2016-02-25 DIAGNOSIS — Z0000 Encounter for general adult medical examination without abnormal findings: Secondary | ICD-10-CM

## 2016-02-25 DIAGNOSIS — Z01419 Encounter for gynecological examination (general) (routine) without abnormal findings: Secondary | ICD-10-CM | POA: Diagnosis not present

## 2016-02-25 DIAGNOSIS — Z23 Encounter for immunization: Secondary | ICD-10-CM

## 2016-02-25 DIAGNOSIS — Z1151 Encounter for screening for human papillomavirus (HPV): Secondary | ICD-10-CM | POA: Insufficient documentation

## 2016-02-25 DIAGNOSIS — Z01411 Encounter for gynecological examination (general) (routine) with abnormal findings: Secondary | ICD-10-CM | POA: Diagnosis not present

## 2016-02-25 MED ORDER — NORELGESTROMIN-ETH ESTRADIOL 150-35 MCG/24HR TD PTWK: 1.0000 | MEDICATED_PATCH | TRANSDERMAL | 11 refills | Status: DC

## 2016-02-25 NOTE — Patient Instructions (Signed)
Please complete lab work prior to leaving.  Continue healthy diet and weight loss.

## 2016-02-25 NOTE — Addendum Note (Signed)
Addended by: Naaman Plummer A on: 02/25/2016 04:56 PM   Modules accepted: Orders

## 2016-02-25 NOTE — Progress Notes (Signed)
Subjective:    Patient ID: Tanya Perkins, female    DOB: October 06, 1980, 35 y.o.   MRN: [id number]  HPI  Patient presents today for complete physical.  Immunizations: tetanus is up to date. Flu shot today. Diet: healthy Exercise: walks Pap Smear: 2014 Dr. Arletta Bale Readings from Last 3 Encounters:  02/25/16 189 lb 9.6 oz (86 kg)  08/09/14 194 lb (88 kg)  07/03/14 195 lb 12.8 oz (88.8 kg)  vision: up to date Dental: up to date (had dental implant last week)   Review of Systems  Constitutional: Negative for unexpected weight change.  HENT: Negative for hearing loss and rhinorrhea.   Eyes: Negative for visual disturbance.  Respiratory: Negative for cough.   Cardiovascular: Negative for leg swelling.  Gastrointestinal: Negative for constipation and diarrhea.  Genitourinary: Negative for dysuria, frequency and menstrual problem.  Musculoskeletal: Negative for arthralgias and myalgias.  Neurological: Negative for headaches.  Hematological: Negative for adenopathy.  Psychiatric/Behavioral:       Denies depression/anixety     Past Medical History:  Diagnosis Date  . Fibroid    uterine  . History of chicken pox   . Thyroid mass 2011   ONCOCYTIC ADENOMA--NON MALIGNANT     Social History   Social History  . Marital status: Single    Spouse name: N/A  . Number of children: N/A  . Years of education: N/A   Occupational History  . Not on file.   Social History Main Topics  . Smoking status: Former Research scientist (life sciences)  . Smokeless tobacco: Never Used  . Alcohol use Yes     Comment: Rare  . Drug use: No  . Sexual activity: Not Currently    Partners: Male    Birth control/ protection: None   Other Topics Concern  . 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 MYOMECTOMY ABDOMINAL APPROACH  05/2007  . THYROID LOBECTOMY     LEFT  . Tooth #20 implant      Family History  Problem Relation Age of Onset  . Hypertension Brother   . Hyperlipidemia Brother   . Diabetes Maternal Grandmother   . Stroke Maternal Grandmother   . Breast cancer Paternal Grandmother     Age 28's  . Diabetes Cousin   . Asthma Cousin   . Hypertension Father   . Hyperlipidemia Father   . Stroke Paternal Grandfather   . Hypertension Paternal Grandfather   . Thyroid disease Mother     ?due to accident  . Leukemia Maternal Grandfather   . Cancer Maternal Grandfather     lung  . Hypertension Maternal Grandfather   . Diabetes Maternal Aunt   . Hypertension Maternal Aunt   . Diabetes Maternal Uncle   . Hypertension Maternal Uncle   . Hypertension Paternal Aunt   . Hypertension Paternal Uncle     No Known Allergies  Current Outpatient Prescriptions on File Prior to Visit  Medication Sig Dispense Refill  . Multiple Vitamin (MULTIVITAMIN) tablet Take 1 tablet by mouth daily.    . norelgestromin-ethinyl estradiol (ORTHO EVRA) 150-35 MCG/24HR transdermal patch Place 1 patch onto the skin once a week. 3 patch 11  . Omega-3 Fatty Acids (FISH OIL) 1000 MG CAPS Take 1 capsule by  mouth every other day.     No current facility-administered medications on file prior to visit.     BP 139/70 (BP Location: Left Arm, Patient Position: Sitting, Cuff Size: Large)   Pulse 73   Temp 97.8 F (36.6 C) (Oral)   Resp 16   Ht 5' 3.6" (1.615 m)   Wt 189 lb 9.6 oz (86 kg)   LMP 02/04/2016   SpO2 100% Comment: room air.  BMI 32.96 kg/m       Objective:   Physical Exam  Physical Exam  Constitutional: She is oriented to person, place, and time. She appears well-developed and well-nourished. No distress.  HENT:  Head: Normocephalic and atraumatic.  Right Ear: Tympanic membrane and ear canal normal.  Left Ear: Tympanic membrane and ear canal normal.  Mouth/Throat:  Oropharynx is clear and moist.  Eyes: Pupils are equal, round, and reactive to light. No scleral icterus.  Neck: Normal range of motion. No thyromegaly present.  Cardiovascular: Normal rate and regular rhythm.   No murmur heard. Pulmonary/Chest: Effort normal and breath sounds normal. No respiratory distress. He has no wheezes. She has no rales. She exhibits no tenderness.  Abdominal: Soft. Bowel sounds are normal. She exhibits no distension and no mass. There is no tenderness. There is no rebound and no guarding.  Musculoskeletal: She exhibits no edema.  Lymphadenopathy:    She has no cervical adenopathy.  Neurological: She is alert and oriented to person, place, and time. She has normal patellar reflexes. She exhibits normal muscle tone. Coordination normal.  Skin: Skin is warm and dry.  Psychiatric: She has a normal mood and affect. Her behavior is normal. Judgment and thought content normal.  Breasts: Examined lying Right: Without masses, retractions, discharge or axillary adenopathy.  Left: Without masses, retractions, discharge or axillary adenopathy.  Inguinal/mons: Normal without inguinal adenopathy  External genitalia: Normal  BUS/Urethra/Skene's glands: Normal  Bladder: Normal  Vagina: Normal  Cervix: Normal  Uterus: normal in size, shape and contour. Midline and mobile  Adnexa/parametria:  Rt: Without masses or tenderness.  Lt: Without masses or tenderness.  Anus and perineum: Normal           Assessment & Plan:   Preventative care- discussed healthy diet and exercise. Obtain routine lab tests. Pap performed today.  Flu shot today.       Assessment & Plan:

## 2016-02-25 NOTE — Progress Notes (Signed)
Pre visit review using our clinic review tool, if applicable. No additional management support is needed unless otherwise documented below in the visit note. 

## 2016-02-26 LAB — HEPATIC FUNCTION PANEL
ALBUMIN: 4 g/dL (ref 3.5–5.2)
ALK PHOS: 61 U/L (ref 39–117)
ALT: 13 U/L (ref 0–35)
AST: 16 U/L (ref 0–37)
Bilirubin, Direct: 0 mg/dL (ref 0.0–0.3)
TOTAL PROTEIN: 7.6 g/dL (ref 6.0–8.3)
Total Bilirubin: 0.2 mg/dL (ref 0.2–1.2)

## 2016-02-26 LAB — CBC WITH DIFFERENTIAL/PLATELET
Basophils Absolute: 0.1 10*3/uL (ref 0.0–0.1)
Basophils Relative: 0.9 % (ref 0.0–3.0)
EOS PCT: 0.9 % (ref 0.0–5.0)
Eosinophils Absolute: 0.1 10*3/uL (ref 0.0–0.7)
HEMATOCRIT: 37.7 % (ref 36.0–46.0)
Hemoglobin: 12.5 g/dL (ref 12.0–15.0)
LYMPHS ABS: 2.8 10*3/uL (ref 0.7–4.0)
LYMPHS PCT: 26.4 % (ref 12.0–46.0)
MCHC: 33.1 g/dL (ref 30.0–36.0)
MCV: 81.1 fl (ref 78.0–100.0)
MONOS PCT: 3.2 % (ref 3.0–12.0)
Monocytes Absolute: 0.3 10*3/uL (ref 0.1–1.0)
NEUTROS PCT: 68.6 % (ref 43.0–77.0)
Neutro Abs: 7.3 10*3/uL (ref 1.4–7.7)
Platelets: 447 10*3/uL — ABNORMAL HIGH (ref 150.0–400.0)
RBC: 4.65 Mil/uL (ref 3.87–5.11)
RDW: 13.7 % (ref 11.5–15.5)
WBC: 10.7 10*3/uL — ABNORMAL HIGH (ref 4.0–10.5)

## 2016-02-26 LAB — URINALYSIS, ROUTINE W REFLEX MICROSCOPIC
BILIRUBIN URINE: NEGATIVE
HGB URINE DIPSTICK: NEGATIVE
Ketones, ur: NEGATIVE
LEUKOCYTES UA: NEGATIVE
NITRITE: NEGATIVE
RBC / HPF: NONE SEEN (ref 0–?)
Specific Gravity, Urine: <=1.005 — AB (ref 1.000–1.030)
Total Protein, Urine: NEGATIVE
URINE GLUCOSE: NEGATIVE
Urobilinogen, UA: 0.2 (ref 0.0–1.0)
pH: 6 (ref 5.0–8.0)

## 2016-02-26 LAB — TSH: TSH: 1.45 u[IU]/mL (ref 0.35–4.50)

## 2016-02-26 LAB — LIPID PANEL
CHOLESTEROL: 149 mg/dL (ref 0–200)
HDL: 52.1 mg/dL (ref >39.00)
LDL Cholesterol: 69 mg/dL (ref 0–99)
NONHDL: 97.36
Total CHOL/HDL Ratio: 3
Triglycerides: 143 mg/dL (ref 0.0–149.0)
VLDL: 28.6 mg/dL (ref 0.0–40.0)

## 2016-02-26 LAB — BASIC METABOLIC PANEL
BUN: 5 mg/dL — ABNORMAL LOW (ref 6–23)
CO2: 27 meq/L (ref 19–32)
Calcium: 9.3 mg/dL (ref 8.4–10.5)
Chloride: 101 mEq/L (ref 96–112)
Creatinine, Ser: 0.76 mg/dL (ref 0.40–1.20)
GFR: 91.26 mL/min (ref >60.00)
GLUCOSE: 69 mg/dL — AB (ref 70–99)
POTASSIUM: 3.7 meq/L (ref 3.5–5.1)
SODIUM: 137 meq/L (ref 135–145)

## 2016-02-28 LAB — CYTOLOGY - PAP
Diagnosis: NEGATIVE
HPV: NOT DETECTED

## 2016-06-17 DIAGNOSIS — K080 Exfoliation of teeth due to systemic causes: Secondary | ICD-10-CM | POA: Diagnosis not present

## 2017-07-06 DIAGNOSIS — K080 Exfoliation of teeth due to systemic causes: Secondary | ICD-10-CM | POA: Diagnosis not present

## 2017-11-20 ENCOUNTER — Encounter: Payer: Self-pay | Admitting: Family

## 2017-12-18 ENCOUNTER — Encounter: Payer: Federal, State, Local not specified - PPO | Admitting: Family

## 2017-12-30 ENCOUNTER — Encounter: Payer: Self-pay | Admitting: Family

## 2017-12-30 ENCOUNTER — Ambulatory Visit (INDEPENDENT_AMBULATORY_CARE_PROVIDER_SITE_OTHER): Payer: Federal, State, Local not specified - PPO | Admitting: Family

## 2017-12-30 VITALS — BP 128/70 | HR 70 | Temp 98.1°F | Resp 18 | Ht 64.0 in | Wt 200.6 lb

## 2017-12-30 DIAGNOSIS — Z0000 Encounter for general adult medical examination without abnormal findings: Secondary | ICD-10-CM

## 2017-12-30 LAB — URINALYSIS, ROUTINE W REFLEX MICROSCOPIC
Bilirubin Urine: NEGATIVE
Ketones, ur: NEGATIVE
Leukocytes, UA: NEGATIVE
Nitrite: NEGATIVE
RBC / HPF: NONE SEEN (ref 0–?)
Specific Gravity, Urine: 1.015 (ref 1.000–1.030)
Total Protein, Urine: NEGATIVE
Urine Glucose: NEGATIVE
Urobilinogen, UA: 0.2 (ref 0.0–1.0)
pH: 6 (ref 5.0–8.0)

## 2017-12-30 LAB — HEPATIC FUNCTION PANEL
ALT: 19 U/L (ref 0–35)
AST: 13 U/L (ref 0–37)
Albumin: 4.2 g/dL (ref 3.5–5.2)
Alkaline Phosphatase: 67 U/L (ref 39–117)
Bilirubin, Direct: 0.1 mg/dL (ref 0.0–0.3)
Total Bilirubin: 0.4 mg/dL (ref 0.2–1.2)
Total Protein: 7.3 g/dL (ref 6.0–8.3)

## 2017-12-30 LAB — CBC WITH DIFFERENTIAL/PLATELET
Basophils Absolute: 0.1 10*3/uL (ref 0.0–0.1)
Basophils Relative: 1.7 % (ref 0.0–3.0)
Eosinophils Absolute: 0.2 10*3/uL (ref 0.0–0.7)
Eosinophils Relative: 2.5 % (ref 0.0–5.0)
HCT: 37.4 % (ref 36.0–46.0)
Hemoglobin: 12.3 g/dL (ref 12.0–15.0)
Lymphocytes Relative: 29.4 % (ref 12.0–46.0)
Lymphs Abs: 2.5 10*3/uL (ref 0.7–4.0)
MCHC: 32.8 g/dL (ref 30.0–36.0)
MCV: 81.5 fl (ref 78.0–100.0)
Monocytes Absolute: 0.3 10*3/uL (ref 0.1–1.0)
Monocytes Relative: 4.1 % (ref 3.0–12.0)
Neutro Abs: 5.3 10*3/uL (ref 1.4–7.7)
Neutrophils Relative %: 62.3 % (ref 43.0–77.0)
Platelets: 458 10*3/uL — ABNORMAL HIGH (ref 150.0–400.0)
RBC: 4.59 Mil/uL (ref 3.87–5.11)
RDW: 13.4 % (ref 11.5–15.5)
WBC: 8.5 10*3/uL (ref 4.0–10.5)

## 2017-12-30 LAB — BASIC METABOLIC PANEL
BUN: 8 mg/dL (ref 6–23)
CALCIUM: 9.3 mg/dL (ref 8.4–10.5)
CO2: 28 meq/L (ref 19–32)
CREATININE: 0.76 mg/dL (ref 0.40–1.20)
Chloride: 101 mEq/L (ref 96–112)
GFR: 90.35 mL/min (ref >60.00)
Glucose, Bld: 92 mg/dL (ref 70–99)
Potassium: 4.2 mEq/L (ref 3.5–5.1)
SODIUM: 137 meq/L (ref 135–145)

## 2017-12-30 LAB — LIPID PANEL
CHOL/HDL RATIO: 3
Cholesterol: 139 mg/dL (ref 0–200)
HDL: 48.9 mg/dL (ref >39.00)
LDL Cholesterol: 73 mg/dL (ref 0–99)
NONHDL: 90.46
Triglycerides: 88 mg/dL (ref 0.0–149.0)
VLDL: 17.6 mg/dL (ref 0.0–40.0)

## 2017-12-30 LAB — TSH: TSH: 1.04 u[IU]/mL (ref 0.35–4.50)

## 2017-12-30 NOTE — Patient Instructions (Signed)
Apply OTC lotrimin twice daily to affected area. Call if discoloration does not improve in the next 2-3 weeks.

## 2017-12-30 NOTE — Progress Notes (Signed)
Subjective:    Patient ID: Tanya Perkins, female    DOB: 10-25-1980, 37 y.o.   MRN: [id number]  HPI  [name] is a 37 yr old female who presents today for   Patient presents today for complete physical.  Immunizations: tdap 2016 Diet: reports healthy diet Exercise:  Tries to exercise 3-4 times a week. Uses elliptical, treadmill , mows her lawn Pap Smear:02/25/16 Vision: 1/19- wears contacts Dental: up to date Wt Readings from Last 3 Encounters:  12/30/17 200 lb 9.6 oz (91 kg)  02/25/16 189 lb 9.6 oz (86 kg)  08/09/14 194 lb (88 kg)     Review of Systems  Constitutional: Negative for unexpected weight change.  HENT: Negative for hearing loss.   Eyes: Negative for visual disturbance.  Respiratory: Negative for cough.   Cardiovascular: Negative for leg swelling.  Genitourinary: Negative for dysuria, frequency and menstrual problem.  Musculoskeletal: Negative for arthralgias and myalgias.  Skin: Negative for rash.  Neurological: Negative for headaches.  Hematological: Negative for adenopathy.  Psychiatric/Behavioral:       Denies anxiety/depression   Past Medical History:  Diagnosis Date  . Fibroid    uterine  . History of chicken pox   . Thyroid mass 2011   ONCOCYTIC ADENOMA--NON MALIGNANT     Social History   Socioeconomic History  . Marital status: Single    Spouse name: Not on file  . Number of children: Not on file  . Years of education: Not on file  . Highest education level: Not on file  Occupational History  . Not on file  Social Needs  . Financial resource strain: Not on file  . Food insecurity:    Worry: Not on file    Inability: Not on file  . Transportation needs:    Medical: Not on file    Non-medical: Not on file  Tobacco Use  . Smoking status: Former Research scientist (life sciences)  . Smokeless tobacco: Never Used  Substance and Sexual Activity  . Alcohol use: Yes    Comment: Rare  . Drug use: No  . Sexual activity: Not Currently    Partners: Male    Birth control/protection: None  Lifestyle  . Physical activity:    Days per week: Not on file    Minutes per session: Not on file  . Stress: Not on file  Relationships  . Social connections:    Talks on phone: Not on file    Gets together: Not on file    Attends religious service: Not on file    Active member of club or organization: Not on file    Attends meetings of clubs or organizations: Not on file    Relationship status: Not on file  . Intimate partner violence:    Fear of current or ex partner: Not on file    Emotionally abused: Not on file    Physically abused: Not on file    Forced sexual activity: Not on file  Other Topics Concern  . 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 MYOMECTOMY ABDOMINAL APPROACH  05/2007  . THYROID LOBECTOMY     LEFT  . Tooth #20 implant      Family History  Problem Relation Age of Onset  . Hypertension  Brother   . Hyperlipidemia Brother   . Diabetes Maternal Grandmother   . Stroke Maternal Grandmother   . Breast cancer Paternal Grandmother        Age 69's  . Diabetes Cousin   . Asthma Cousin   . Hypertension Father   . Hyperlipidemia Father   . Stroke Paternal Grandfather   . Hypertension Paternal Grandfather   . Thyroid disease Mother        ?due to accident  . Leukemia Maternal Grandfather   . Cancer Maternal Grandfather        lung  . Hypertension Maternal Grandfather   . Diabetes Maternal Aunt   . Hypertension Maternal Aunt   . Diabetes Maternal Uncle   . Hypertension Maternal Uncle   . Hypertension Paternal Aunt   . Hypertension Paternal Uncle     No Known Allergies  Current Outpatient Medications on File Prior to Visit  Medication Sig Dispense Refill  . Multiple Vitamin (MULTIVITAMIN) tablet Take  1 tablet by mouth daily.    . Omega-3 Fatty Acids (FISH OIL) 1000 MG CAPS Take 1 capsule by mouth every other day.     No current facility-administered medications on file prior to visit.     BP 128/70 (BP Location: Left Arm, Cuff Size: Large)   Pulse 70   Temp 98.1 F (36.7 C) (Oral)   Resp 18   Ht 5\' 4"  (1.626 m)   Wt 200 lb 9.6 oz (91 kg)   LMP 12/16/2017   SpO2 99%   BMI 34.43 kg/m       Objective:   Physical Exam  Physical Exam  Constitutional: She is oriented to person, place, and time. She appears well-developed and well-nourished. No distress.  HENT:  Head: Normocephalic and atraumatic.  Right Ear: Tympanic membrane and ear canal normal.  Left Ear: Tympanic membrane and ear canal normal.  Mouth/Throat: Oropharynx is clear and moist.  Eyes: Pupils are equal, round, and reactive to light. No scleral icterus.  Neck: Normal range of motion. No thyromegaly present.  Cardiovascular: Normal rate and regular rhythm.   No murmur heard. Pulmonary/Chest: Effort normal and breath sounds normal. No respiratory distress. He has no wheezes. She has no rales. She exhibits no tenderness.  Abdominal: Soft. Bowel sounds are normal. She exhibits no distension and no mass. There is no tenderness. There is no rebound and no guarding.  Musculoskeletal: She exhibits no edema.  Lymphadenopathy:    She has no cervical adenopathy.  Neurological: She is alert and oriented to person, place, and time. She has normal patellar reflexes. She exhibits normal muscle tone. Coordination normal.  Skin: Skin is warm and dry. hyperpigmented rash right axilla Psychiatric: She has a normal mood and affect. Her behavior is normal. Judgment and thought content normal.  Breasts: Examined lying Right: Without masses, retractions, discharge or axillary adenopathy.  Left: Without masses, retractions, discharge or axillary adenopathy.          Assessment & Plan:  Preventative care- discussed healthy diet,  exercise, weight loss. Pap up to date, tetanus up to date. Obtain routine lab work.  Hyperpigmented fungal rash- right axilla- recommended lotrimin cream.        Assessment & Plan:

## 2019-04-13 DIAGNOSIS — M5412 Radiculopathy, cervical region: Secondary | ICD-10-CM | POA: Diagnosis not present

## 2019-04-13 DIAGNOSIS — M9901 Segmental and somatic dysfunction of cervical region: Secondary | ICD-10-CM | POA: Diagnosis not present

## 2019-04-14 DIAGNOSIS — M9902 Segmental and somatic dysfunction of thoracic region: Secondary | ICD-10-CM | POA: Diagnosis not present

## 2019-04-14 DIAGNOSIS — M5412 Radiculopathy, cervical region: Secondary | ICD-10-CM | POA: Diagnosis not present

## 2019-04-14 DIAGNOSIS — M9903 Segmental and somatic dysfunction of lumbar region: Secondary | ICD-10-CM | POA: Diagnosis not present

## 2019-04-14 DIAGNOSIS — M9901 Segmental and somatic dysfunction of cervical region: Secondary | ICD-10-CM | POA: Diagnosis not present

## 2019-04-18 DIAGNOSIS — M9903 Segmental and somatic dysfunction of lumbar region: Secondary | ICD-10-CM | POA: Diagnosis not present

## 2019-04-18 DIAGNOSIS — M9902 Segmental and somatic dysfunction of thoracic region: Secondary | ICD-10-CM | POA: Diagnosis not present

## 2019-04-18 DIAGNOSIS — M9901 Segmental and somatic dysfunction of cervical region: Secondary | ICD-10-CM | POA: Diagnosis not present

## 2019-04-18 DIAGNOSIS — M5412 Radiculopathy, cervical region: Secondary | ICD-10-CM | POA: Diagnosis not present

## 2019-04-21 DIAGNOSIS — M9901 Segmental and somatic dysfunction of cervical region: Secondary | ICD-10-CM | POA: Diagnosis not present

## 2019-04-21 DIAGNOSIS — M9902 Segmental and somatic dysfunction of thoracic region: Secondary | ICD-10-CM | POA: Diagnosis not present

## 2019-04-21 DIAGNOSIS — M5412 Radiculopathy, cervical region: Secondary | ICD-10-CM | POA: Diagnosis not present

## 2019-04-21 DIAGNOSIS — M9903 Segmental and somatic dysfunction of lumbar region: Secondary | ICD-10-CM | POA: Diagnosis not present

## 2019-04-25 DIAGNOSIS — M9902 Segmental and somatic dysfunction of thoracic region: Secondary | ICD-10-CM | POA: Diagnosis not present

## 2019-04-25 DIAGNOSIS — M9901 Segmental and somatic dysfunction of cervical region: Secondary | ICD-10-CM | POA: Diagnosis not present

## 2019-04-25 DIAGNOSIS — M9903 Segmental and somatic dysfunction of lumbar region: Secondary | ICD-10-CM | POA: Diagnosis not present

## 2019-04-25 DIAGNOSIS — M5412 Radiculopathy, cervical region: Secondary | ICD-10-CM | POA: Diagnosis not present

## 2019-04-27 DIAGNOSIS — M9903 Segmental and somatic dysfunction of lumbar region: Secondary | ICD-10-CM | POA: Diagnosis not present

## 2019-04-27 DIAGNOSIS — M9902 Segmental and somatic dysfunction of thoracic region: Secondary | ICD-10-CM | POA: Diagnosis not present

## 2019-04-27 DIAGNOSIS — M5412 Radiculopathy, cervical region: Secondary | ICD-10-CM | POA: Diagnosis not present

## 2019-04-27 DIAGNOSIS — M9901 Segmental and somatic dysfunction of cervical region: Secondary | ICD-10-CM | POA: Diagnosis not present

## 2019-05-02 DIAGNOSIS — M9902 Segmental and somatic dysfunction of thoracic region: Secondary | ICD-10-CM | POA: Diagnosis not present

## 2019-05-02 DIAGNOSIS — M9901 Segmental and somatic dysfunction of cervical region: Secondary | ICD-10-CM | POA: Diagnosis not present

## 2019-05-02 DIAGNOSIS — M5412 Radiculopathy, cervical region: Secondary | ICD-10-CM | POA: Diagnosis not present

## 2019-05-02 DIAGNOSIS — M9903 Segmental and somatic dysfunction of lumbar region: Secondary | ICD-10-CM | POA: Diagnosis not present

## 2019-05-04 DIAGNOSIS — M9901 Segmental and somatic dysfunction of cervical region: Secondary | ICD-10-CM | POA: Diagnosis not present

## 2019-05-04 DIAGNOSIS — M9903 Segmental and somatic dysfunction of lumbar region: Secondary | ICD-10-CM | POA: Diagnosis not present

## 2019-05-04 DIAGNOSIS — M9902 Segmental and somatic dysfunction of thoracic region: Secondary | ICD-10-CM | POA: Diagnosis not present

## 2019-05-04 DIAGNOSIS — M5412 Radiculopathy, cervical region: Secondary | ICD-10-CM | POA: Diagnosis not present

## 2019-05-11 DIAGNOSIS — M5412 Radiculopathy, cervical region: Secondary | ICD-10-CM | POA: Diagnosis not present

## 2019-05-11 DIAGNOSIS — M9901 Segmental and somatic dysfunction of cervical region: Secondary | ICD-10-CM | POA: Diagnosis not present

## 2019-05-11 DIAGNOSIS — M9903 Segmental and somatic dysfunction of lumbar region: Secondary | ICD-10-CM | POA: Diagnosis not present

## 2019-05-11 DIAGNOSIS — M9902 Segmental and somatic dysfunction of thoracic region: Secondary | ICD-10-CM | POA: Diagnosis not present

## 2019-06-07 DIAGNOSIS — B359 Dermatophytosis, unspecified: Secondary | ICD-10-CM | POA: Diagnosis not present

## 2019-06-07 DIAGNOSIS — L859 Epidermal thickening, unspecified: Secondary | ICD-10-CM | POA: Diagnosis not present

## 2019-07-23 ENCOUNTER — Ambulatory Visit: Payer: Federal, State, Local not specified - PPO | Attending: Internal Medicine

## 2019-07-23 DIAGNOSIS — Z23 Encounter for immunization: Secondary | ICD-10-CM

## 2019-07-23 NOTE — Progress Notes (Signed)
   Covid-19 Vaccination Clinic  Name:  Tanya Perkins    MRN: [id number] DOB: 1980/07/25  07/23/2019  [name] was observed post Covid-19 immunization for 15 minutes without incident. She was provided with Vaccine Information Sheet and instruction to access the V-Safe system.   [name] was instructed to call 911 with any severe reactions post vaccine: Marland Kitchen Difficulty breathing  . Swelling of face and throat  . A fast heartbeat  . A bad rash all over body  . Dizziness and weakness   Immunizations Administered    Name Date Dose VIS Date Route   Moderna COVID-19 Vaccine 07/23/2019 10:29 AM 0.5 mL 04/05/2019 Intramuscular   Manufacturer: Moderna   Lot: [id number]   [id number]

## 2019-08-24 ENCOUNTER — Ambulatory Visit: Payer: Federal, State, Local not specified - PPO | Attending: Internal Medicine

## 2019-08-24 DIAGNOSIS — Z23 Encounter for immunization: Secondary | ICD-10-CM

## 2019-08-24 NOTE — Progress Notes (Signed)
   Covid-19 Vaccination Clinic  Name:  Tanya Perkins    MRN: [id number] DOB: 21-Jul-1980  08/24/2019  [name] was observed post Covid-19 immunization for 15 minutes without incident. She was provided with Vaccine Information Sheet and instruction to access the V-Safe system.   [name] was instructed to call 911 with any severe reactions post vaccine: Marland Kitchen Difficulty breathing  . Swelling of face and throat  . A fast heartbeat  . A bad rash all over body  . Dizziness and weakness   Immunizations Administered    Name Date Dose VIS Date Route   Moderna COVID-19 Vaccine 08/24/2019 11:40 AM 0.5 mL 04/2019 Intramuscular   Manufacturer: Moderna   Lot: [id number]   [id number][id number]

## 2020-03-05 ENCOUNTER — Encounter: Payer: Self-pay | Admitting: Family

## 2020-03-20 ENCOUNTER — Encounter: Payer: Federal, State, Local not specified - PPO | Admitting: Family

## 2020-07-02 ENCOUNTER — Telehealth: Payer: Self-pay

## 2020-07-02 DIAGNOSIS — M9903 Segmental and somatic dysfunction of lumbar region: Secondary | ICD-10-CM | POA: Diagnosis not present

## 2020-07-02 DIAGNOSIS — M9901 Segmental and somatic dysfunction of cervical region: Secondary | ICD-10-CM | POA: Diagnosis not present

## 2020-07-02 DIAGNOSIS — M5412 Radiculopathy, cervical region: Secondary | ICD-10-CM | POA: Diagnosis not present

## 2020-07-02 DIAGNOSIS — M9902 Segmental and somatic dysfunction of thoracic region: Secondary | ICD-10-CM | POA: Diagnosis not present

## 2020-07-02 NOTE — Telephone Encounter (Signed)
Pt would like to transfer her care from [place] to [place]. Please advise

## 2020-07-02 NOTE — Telephone Encounter (Signed)
Ok with me as well  

## 2020-07-02 NOTE — Telephone Encounter (Signed)
OK with me.

## 2020-07-04 DIAGNOSIS — M5412 Radiculopathy, cervical region: Secondary | ICD-10-CM | POA: Diagnosis not present

## 2020-07-04 DIAGNOSIS — M9902 Segmental and somatic dysfunction of thoracic region: Secondary | ICD-10-CM | POA: Diagnosis not present

## 2020-07-04 DIAGNOSIS — M9903 Segmental and somatic dysfunction of lumbar region: Secondary | ICD-10-CM | POA: Diagnosis not present

## 2020-07-04 DIAGNOSIS — M9901 Segmental and somatic dysfunction of cervical region: Secondary | ICD-10-CM | POA: Diagnosis not present

## 2020-07-06 NOTE — Telephone Encounter (Signed)
Pt is scheduled °

## 2020-07-09 ENCOUNTER — Ambulatory Visit: Payer: Federal, State, Local not specified - PPO | Admitting: Physician Assistant

## 2020-07-09 ENCOUNTER — Encounter: Payer: Self-pay | Admitting: Physician Assistant

## 2020-07-09 ENCOUNTER — Other Ambulatory Visit: Payer: Self-pay

## 2020-07-09 VITALS — BP 154/82 | HR 72 | Temp 98.2°F | Ht 64.0 in | Wt 198.2 lb

## 2020-07-09 DIAGNOSIS — Z0000 Encounter for general adult medical examination without abnormal findings: Secondary | ICD-10-CM | POA: Diagnosis not present

## 2020-07-09 DIAGNOSIS — Z8349 Family history of other endocrine, nutritional and metabolic diseases: Secondary | ICD-10-CM | POA: Diagnosis not present

## 2020-07-09 DIAGNOSIS — M545 Low back pain, unspecified: Secondary | ICD-10-CM | POA: Diagnosis not present

## 2020-07-09 DIAGNOSIS — R030 Elevated blood-pressure reading, without diagnosis of hypertension: Secondary | ICD-10-CM

## 2020-07-09 DIAGNOSIS — Z1231 Encounter for screening mammogram for malignant neoplasm of breast: Secondary | ICD-10-CM | POA: Diagnosis not present

## 2020-07-09 LAB — LIPID PANEL
Cholesterol: 183 mg/dL (ref 0–200)
HDL: 46.1 mg/dL (ref >39.00)
LDL Cholesterol: 113 mg/dL — ABNORMAL HIGH (ref 0–99)
NonHDL: 136.94
Total CHOL/HDL Ratio: 4
Triglycerides: 120 mg/dL (ref 0.0–149.0)
VLDL: 24 mg/dL (ref 0.0–40.0)

## 2020-07-09 LAB — COMPREHENSIVE METABOLIC PANEL
ALT: 17 U/L (ref 0–35)
AST: 17 U/L (ref 0–37)
Albumin: 3.7 g/dL (ref 3.5–5.2)
Alkaline Phosphatase: 59 U/L (ref 39–117)
BUN: 12 mg/dL (ref 6–23)
CO2: 29 mEq/L (ref 19–32)
Calcium: 9.5 mg/dL (ref 8.4–10.5)
Chloride: 100 mEq/L (ref 96–112)
Creatinine, Ser: 1.06 mg/dL (ref 0.40–1.20)
GFR: 65.54 mL/min (ref >60.00)
Glucose, Bld: 94 mg/dL (ref 70–99)
Potassium: 3.6 mEq/L (ref 3.5–5.1)
Sodium: 138 mEq/L (ref 135–145)
Total Bilirubin: 0.4 mg/dL (ref 0.2–1.2)
Total Protein: 6.9 g/dL (ref 6.0–8.3)

## 2020-07-09 LAB — CBC WITH DIFFERENTIAL/PLATELET
Basophils Absolute: 0.1 10*3/uL (ref 0.0–0.1)
Basophils Relative: 0.9 % (ref 0.0–3.0)
Eosinophils Absolute: 0.1 10*3/uL (ref 0.0–0.7)
Eosinophils Relative: 1.3 % (ref 0.0–5.0)
HCT: 34.4 % — ABNORMAL LOW (ref 36.0–46.0)
Hemoglobin: 11.7 g/dL — ABNORMAL LOW (ref 12.0–15.0)
Lymphocytes Relative: 20.5 % (ref 12.0–46.0)
Lymphs Abs: 2.3 10*3/uL (ref 0.7–4.0)
MCHC: 33.9 g/dL (ref 30.0–36.0)
MCV: 79.8 fl (ref 78.0–100.0)
Monocytes Absolute: 0.5 10*3/uL (ref 0.1–1.0)
Monocytes Relative: 4.6 % (ref 3.0–12.0)
Neutro Abs: 8.1 10*3/uL — ABNORMAL HIGH (ref 1.4–7.7)
Neutrophils Relative %: 72.7 % (ref 43.0–77.0)
Platelets: 450 10*3/uL — ABNORMAL HIGH (ref 150.0–400.0)
RBC: 4.31 Mil/uL (ref 3.87–5.11)
RDW: 13.7 % (ref 11.5–15.5)
WBC: 11.1 10*3/uL — ABNORMAL HIGH (ref 4.0–10.5)

## 2020-07-09 LAB — TSH: TSH: 1.84 u[IU]/mL (ref 0.35–4.50)

## 2020-07-09 NOTE — Patient Instructions (Signed)
Good to meet you today!  Please go to the lab and I will call or message with results. Someone will call to schedule mammogram for you.  Please schedule pap smear later this year. Call sooner if any concerns.     Preventive Care 41-41 Years Old, Female Preventive care refers to lifestyle choices and visits with your health care provider that can promote health and wellness. This includes:  A yearly physical exam. This is also called an annual wellness visit.  Regular dental and eye exams.  Immunizations.  Screening for certain conditions.  Healthy lifestyle choices, such as: ? Eating a healthy diet. ? Getting regular exercise. ? Not using drugs or products that contain nicotine and tobacco. ? Limiting alcohol use. What can I expect for my preventive care visit? Physical exam Your health care provider will check your:  Height and weight. These may be used to calculate your BMI (body mass index). BMI is a measurement that tells if you are at a healthy weight.  Heart rate and blood pressure.  Body temperature.  Skin for abnormal spots. Counseling Your health care provider may ask you questions about your:  Past medical problems.  Family's medical history.  Alcohol, tobacco, and drug use.  Emotional well-being.  Home life and relationship well-being.  Sexual activity.  Diet, exercise, and sleep habits.  Work and work Statistician.  Access to firearms.  Method of birth control.  Menstrual cycle.  Pregnancy history. What immunizations do I need? Vaccines are usually given at various ages, according to a schedule. Your health care provider will recommend vaccines for you based on your age, medical history, and lifestyle or other factors, such as travel or where you work.   What tests do I need? Blood tests  Lipid and cholesterol levels. These may be checked every 5 years, or more often if you are over 60 years old.  Hepatitis C test.  Hepatitis B  test. Screening  Lung cancer screening. You may have this screening every year starting at age 61 if you have a 30-pack-year history of smoking and currently smoke or have quit within the past 15 years.  Colorectal cancer screening. ? All adults should have this screening starting at age 32 and continuing until age 88. ? Your health care provider may recommend screening at age 8 if you are at increased risk. ? You will have tests every 1-10 years, depending on your results and the type of screening test.  Diabetes screening. ? This is done by checking your blood sugar (glucose) after you have not eaten for a while (fasting). ? You may have this done every 1-3 years.  Mammogram. ? This may be done every 1-2 years. ? Talk with your health care provider about when you should start having regular mammograms. This may depend on whether you have a family history of breast cancer.  BRCA-related cancer screening. This may be done if you have a family history of breast, ovarian, tubal, or peritoneal cancers.  Pelvic exam and Pap test. ? This may be done every 3 years starting at age 75. ? Starting at age 57, this may be done every 5 years if you have a Pap test in combination with an HPV test. Other tests  STD (sexually transmitted disease) testing, if you are at risk.  Bone density scan. This is done to screen for osteoporosis. You may have this scan if you are at high risk for osteoporosis. Talk with your health care provider about your  test results, treatment options, and if necessary, the need for more tests. Follow these instructions at home: Eating and drinking  Eat a diet that includes fresh fruits and vegetables, whole grains, lean protein, and low-fat dairy products.  Take vitamin and mineral supplements as recommended by your health care provider.  Do not drink alcohol if: ? Your health care provider tells you not to drink. ? You are pregnant, may be pregnant, or are planning  to become pregnant.  If you drink alcohol: ? Limit how much you have to 0-1 drink a day. ? Be aware of how much alcohol is in your drink. In the [place] one drink equals one 12 oz bottle of beer (355 mL), one 5 oz glass of wine (148 mL), or one 1 oz glass of hard liquor (44 mL).   Lifestyle  Take daily care of your teeth and gums. Brush your teeth every morning and night with fluoride toothpaste. Floss one time each day.  Stay active. Exercise for at least 30 minutes 5 or more days each week.  Do not use any products that contain nicotine or tobacco, such as cigarettes, e-cigarettes, and chewing tobacco. If you need help quitting, ask your health care provider.  Do not use drugs.  If you are sexually active, practice safe sex. Use a condom or other form of protection to prevent STIs (sexually transmitted infections).  If you do not wish to become pregnant, use a form of birth control. If you plan to become pregnant, see your health care provider for a prepregnancy visit.  If told by your health care provider, take low-dose aspirin daily starting at age 60.  Find healthy ways to cope with stress, such as: ? Meditation, yoga, or listening to music. ? Journaling. ? Talking to a trusted person. ? Spending time with friends and family. Safety  Always wear your seat belt while driving or riding in a vehicle.  Do not drive: ? If you have been drinking alcohol. Do not ride with someone who has been drinking. ? When you are tired or distracted. ? While texting.  Wear a helmet and other protective equipment during sports activities.  If you have firearms in your house, make sure you follow all gun safety procedures. What's next?  Visit your health care provider once a year for an annual wellness visit.  Ask your health care provider how often you should have your eyes and teeth checked.  Stay up to date on all vaccines. This information is not intended to replace advice given to you  by your health care provider. Make sure you discuss any questions you have with your health care provider. Document Revised: 01/24/2020 Document Reviewed: 12/31/2017 Elsevier Patient Education  2021 [employer].

## 2020-07-09 NOTE — Progress Notes (Signed)
Established Patient Office Visit  Subjective:  Patient ID: Tanya Perkins, female    DOB: Jun 19, 1980  Age: 40 y.o. MRN: [id number]  CC:  Chief Complaint  Patient presents with  . Transitions Of Care  . Annual Exam    HPI Tanya Perkins presents for transition of care and annual exam. Works for [employer] the last 14 years. She has one 4 year old son.  She does have lower back pain and has been seeing a Restaurant manager, fast food.   Exercise: She has been walking about one mile every few days. Nutrition: "Ok, not great." Skips breakfast. Caffeine: Sodas usually once daily or once every other day; rarely coffee Sleep: 8-9 hours consistent, no concerns Mental health: "Half full type of person"  Menstrual cycles: Usually regular, just started 07/08/20   Mammogram - She has not had one done yet Colonoscopy - At age 18 Pap - Last one done 02/25/16, normal  Immunizations - Did not do flu vaccine this past year; COVID Moderna and booster finished  PMH, surgical hx, family hx, and social hx were all personally reviewed by me today.   Past Medical History:  Diagnosis Date  . Fibroid    uterine  . History of chicken pox   . Thyroid disease   . 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 MYOMECTOMY ABDOMINAL APPROACH  05/2007  . THYROID LOBECTOMY     LEFT  . Tooth #20 implant      Family History  Problem Relation Age of Onset  . Hypertension Brother   . Hyperlipidemia Brother   . Diabetes Maternal Grandmother   . Stroke Maternal Grandmother   . Breast cancer Paternal Grandmother        Age 71's  . Diabetes Cousin   . Asthma Cousin   . Hypertension Father   . Hyperlipidemia Father   . Stroke Paternal Grandfather   . Hypertension Paternal Grandfather   . Thyroid disease Mother        ?due to accident  . Leukemia Maternal Grandfather   . Cancer Maternal Grandfather        lung  . Hypertension  Maternal Grandfather   . Diabetes Maternal Aunt   . Hypertension Maternal Aunt   . Diabetes Maternal Uncle   . Hypertension Maternal Uncle   . Hypertension Paternal Aunt   . Hypertension Paternal Uncle     Social History   Socioeconomic History  . Marital status: Single    Spouse name: Not on file  . Number of children: Not on file  . Years of education: Not on file  . Highest education level: Not on file  Occupational History  . Not on file  Tobacco Use  . Smoking status: Former Research scientist (life sciences)  . Smokeless tobacco: Never Used  Substance and Sexual Activity  . Alcohol use: Yes    Comment: Rare  . Drug use: No  . Sexual activity: Not Currently    Partners: Male    Birth control/protection: None  Other Topics Concern  . 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 Multiple Vitamin (MULTIVITAMIN) tablet Take 1 tablet by mouth daily.    . Omega-3 Fatty Acids (FISH OIL) 1000 MG CAPS Take 1 capsule by mouth every other day.     No facility-administered medications prior to visit.    No Known Allergies  ROS Review of Systems  Constitutional: Negative for activity change, appetite change, fever and unexpected weight change.  HENT: Negative for congestion.   Eyes: Negative for visual disturbance.  Respiratory: Negative for apnea, cough and shortness of breath.   Cardiovascular: Negative for chest pain, palpitations and leg swelling.  Gastrointestinal: Negative for abdominal pain,  blood in stool, constipation and diarrhea.  Endocrine: Negative for polydipsia, polyphagia and polyuria.  Genitourinary: Negative for dysuria and pelvic pain.  Musculoskeletal: Negative for arthralgias.  Skin: Negative for rash.  Neurological: Negative for dizziness, weakness and headaches.  Hematological: Negative for adenopathy. Does not bruise/bleed easily.  Psychiatric/Behavioral: Negative for sleep disturbance and suicidal ideas. The patient is not nervous/anxious.       Objective:    Physical Exam Vitals and nursing note reviewed.  Constitutional:      General: She is not in acute distress.    Appearance: She is obese.  HENT:     Head: Normocephalic and atraumatic.     Right Ear: Tympanic membrane normal.     Left Ear: Tympanic membrane normal.     Nose: Nose normal.     Mouth/Throat:     Mouth: Mucous membranes are moist.  Eyes:     Pupils: Pupils are equal, round, and reactive to light.  Cardiovascular:     Rate and Rhythm: Normal rate and regular rhythm.     Pulses: Normal pulses.     Heart sounds: Normal heart sounds.  Pulmonary:     Effort: Pulmonary effort is normal.     Breath sounds: Normal breath sounds.  Abdominal:     General: Abdomen is flat. Bowel sounds are normal. There is no distension.     Palpations: Abdomen is soft.     Tenderness: There is no abdominal tenderness.  Musculoskeletal:        General: Normal range of motion.     Cervical back: Normal range of motion.  Lymphadenopathy:     Cervical: No cervical adenopathy.  Neurological:     General: No focal deficit present.     Mental Status: She is alert.  Psychiatric:        Mood and Affect: Mood normal.        Behavior: Behavior normal.     BP (!) 154/82   Pulse 72   Temp 98.2 F (36.8 C)   Ht 5\' 4"  (1.626 m)   Wt 198 lb 3.2 oz (89.9 kg)   LMP 07/08/2020   SpO2 98%   BMI 34.02 kg/m  Wt Readings from Last 3 Encounters:  07/09/20 198 lb 3.2 oz (89.9 kg)  12/30/17 200 lb 9.6 oz  (91 kg)  02/25/16 189 lb 9.6 oz (86 kg)     Health Maintenance Due  Topic Date Due  . Hepatitis C Screening  Never done  . HIV Screening  Never done  . PAP SMEAR-Modifier  02/25/2019    There are no preventive care reminders to display for this patient.  Lab Results  Component Value Date  TSH 1.04 12/30/2017   Lab Results  Component Value Date   WBC 8.5 12/30/2017   HGB 12.3 12/30/2017   HCT 37.4 12/30/2017   MCV 81.5 12/30/2017   PLT 458.0 (H) 12/30/2017   Lab Results  Component Value Date   NA 137 12/30/2017   K 4.2 12/30/2017   CO2 28 12/30/2017   GLUCOSE 92 12/30/2017   BUN 8 12/30/2017   CREATININE 0.76 12/30/2017   BILITOT 0.4 12/30/2017   ALKPHOS 67 12/30/2017   AST 13 12/30/2017   ALT 19 12/30/2017   PROT 7.3 12/30/2017   ALBUMIN 4.2 12/30/2017   CALCIUM 9.3 12/30/2017   GFR 90.35 12/30/2017   Lab Results  Component Value Date   CHOL 139 12/30/2017   Lab Results  Component Value Date   HDL 48.90 12/30/2017   Lab Results  Component Value Date   LDLCALC 73 12/30/2017   Lab Results  Component Value Date   TRIG 88.0 12/30/2017   Lab Results  Component Value Date   CHOLHDL 3 12/30/2017   No results found for: HGBA1C    Assessment & Plan:   Problem List Items Addressed This Visit   None   Visit Diagnoses    Encounter for annual physical exam    -  Primary   Relevant Orders   CBC with Differential/Platelet   Comprehensive metabolic panel   Lipid panel   TSH   MM 3D SCREEN BREAST BILATERAL   Family history of thyroid disease in mother       Relevant Orders   TSH   Encounter for screening mammogram for malignant neoplasm of breast       Relevant Orders   MM 3D SCREEN BREAST BILATERAL   Midline low back pain without sciatica, unspecified chronicity       Elevated blood pressure reading          No orders of the defined types were placed in this encounter.   Follow-up: No follow-ups on file.   1. Encounter for annual  physical exam Age-appropriate screening and counseling performed today. Will check labs and call with results.  She is going to start working on increasing her walking daily as well as her nutrition.  Advised to cut out sodas almost completely and to only have these very rarely.  2. Family history of thyroid disease in mother Check TSH today and will call with results.  She has a history of thyroid mass that was removed.  No problems since then and has not ever needed thyroid medication.  3. Encounter for screening mammogram for malignant neoplasm of breast She will be turning 40 this year and has not had a mammogram yet.  I sent order out for this today so she can get this done this year.  4. Midline low back pain without sciatica, unspecified chronicity So far she is seeing a chiropractor for her low back pain.  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  I recommend she walk more and do more stretches at home.  She will let me know if she needs more help with this.  5. Elevated blood pressure reading Her blood pressure is elevated in office today.  I advised her to monitor this and let me know if it continues to stay in the 140s to 150s over 90s and then we will need to discuss medication at that point.  Again walking, low-salt diet, and more fluids will help bring this down.  This visit occurred during the SARS-CoV-2 public health emergency.  Safety protocols were in place, including screening questions prior to the visit, additional usage of staff PPE, and extensive cleaning of exam room while observing appropriate contact time as indicated for disinfecting solutions.   Dereck Agerton M Shanda Cadotte, PA-C

## 2020-07-12 DIAGNOSIS — M5412 Radiculopathy, cervical region: Secondary | ICD-10-CM | POA: Diagnosis not present

## 2020-07-12 DIAGNOSIS — M9901 Segmental and somatic dysfunction of cervical region: Secondary | ICD-10-CM | POA: Diagnosis not present

## 2020-07-12 DIAGNOSIS — M9903 Segmental and somatic dysfunction of lumbar region: Secondary | ICD-10-CM | POA: Diagnosis not present

## 2020-07-12 DIAGNOSIS — M9902 Segmental and somatic dysfunction of thoracic region: Secondary | ICD-10-CM | POA: Diagnosis not present

## 2020-08-31 DIAGNOSIS — L578 Other skin changes due to chronic exposure to nonionizing radiation: Secondary | ICD-10-CM | POA: Diagnosis not present

## 2020-08-31 DIAGNOSIS — D485 Neoplasm of uncertain behavior of skin: Secondary | ICD-10-CM | POA: Diagnosis not present

## 2020-08-31 DIAGNOSIS — L719 Rosacea, unspecified: Secondary | ICD-10-CM | POA: Diagnosis not present

## 2020-08-31 DIAGNOSIS — C44519 Basal cell carcinoma of skin of other part of trunk: Secondary | ICD-10-CM | POA: Diagnosis not present

## 2020-08-31 DIAGNOSIS — L814 Other melanin hyperpigmentation: Secondary | ICD-10-CM | POA: Diagnosis not present

## 2020-08-31 DIAGNOSIS — D1801 Hemangioma of skin and subcutaneous tissue: Secondary | ICD-10-CM | POA: Diagnosis not present

## 2020-09-12 DIAGNOSIS — Z713 Dietary counseling and surveillance: Secondary | ICD-10-CM | POA: Diagnosis not present

## 2020-10-16 DIAGNOSIS — Z713 Dietary counseling and surveillance: Secondary | ICD-10-CM | POA: Diagnosis not present

## 2020-10-30 DIAGNOSIS — C44519 Basal cell carcinoma of skin of other part of trunk: Secondary | ICD-10-CM | POA: Diagnosis not present

## 2020-11-23 DIAGNOSIS — Z713 Dietary counseling and surveillance: Secondary | ICD-10-CM | POA: Diagnosis not present

## 2020-12-04 ENCOUNTER — Encounter: Payer: Federal, State, Local not specified - PPO | Admitting: Physician Assistant

## 2021-07-01 ENCOUNTER — Ambulatory Visit: Payer: Federal, State, Local not specified - PPO | Admitting: Physician Assistant

## 2021-07-01 ENCOUNTER — Encounter: Payer: Self-pay | Admitting: Physician Assistant

## 2021-07-01 ENCOUNTER — Other Ambulatory Visit: Payer: Self-pay

## 2021-07-01 VITALS — BP 126/80 | HR 88 | Temp 97.9°F | Ht 64.0 in | Wt 193.5 lb

## 2021-07-01 DIAGNOSIS — C44519 Basal cell carcinoma of skin of other part of trunk: Secondary | ICD-10-CM | POA: Insufficient documentation

## 2021-07-01 DIAGNOSIS — R109 Unspecified abdominal pain: Secondary | ICD-10-CM | POA: Diagnosis not present

## 2021-07-01 LAB — CBC WITH DIFFERENTIAL/PLATELET
Basophils Absolute: 0.1 10*3/uL (ref 0.0–0.1)
Basophils Relative: 1 % (ref 0.0–3.0)
Eosinophils Absolute: 0.2 10*3/uL (ref 0.0–0.7)
Eosinophils Relative: 1.3 % (ref 0.0–5.0)
HCT: 37.8 % (ref 36.0–46.0)
Hemoglobin: 12.4 g/dL (ref 12.0–15.0)
Lymphocytes Relative: 21.7 % (ref 12.0–46.0)
Lymphs Abs: 2.6 10*3/uL (ref 0.7–4.0)
MCHC: 32.7 g/dL (ref 30.0–36.0)
MCV: 81.4 fl (ref 78.0–100.0)
Monocytes Absolute: 0.6 10*3/uL (ref 0.1–1.0)
Monocytes Relative: 4.5 % (ref 3.0–12.0)
Neutro Abs: 8.7 10*3/uL — ABNORMAL HIGH (ref 1.4–7.7)
Neutrophils Relative %: 71.5 % (ref 43.0–77.0)
Platelets: 239 10*3/uL (ref 150.0–400.0)
RBC: 4.65 Mil/uL (ref 3.87–5.11)
RDW: 13.7 % (ref 11.5–15.5)
WBC: 12.2 10*3/uL — ABNORMAL HIGH (ref 4.0–10.5)

## 2021-07-01 LAB — POCT URINALYSIS DIPSTICK
Bilirubin, UA: NEGATIVE
Blood, UA: POSITIVE
Glucose, UA: NEGATIVE
Ketones, UA: NEGATIVE
Leukocytes, UA: NEGATIVE
Nitrite, UA: NEGATIVE
Protein, UA: POSITIVE — AB
Spec Grav, UA: 1.025 (ref 1.010–1.025)
Urobilinogen, UA: 0.2 E.U./dL
pH, UA: 5 (ref 5.0–8.0)

## 2021-07-01 LAB — COMPREHENSIVE METABOLIC PANEL
ALT: 14 U/L (ref 0–35)
AST: 14 U/L (ref 0–37)
Albumin: 3.9 g/dL (ref 3.5–5.2)
Alkaline Phosphatase: 63 U/L (ref 39–117)
BUN: 12 mg/dL (ref 6–23)
CO2: 30 mEq/L (ref 19–32)
Calcium: 9.6 mg/dL (ref 8.4–10.5)
Chloride: 100 mEq/L (ref 96–112)
Creatinine, Ser: 1.15 mg/dL (ref 0.40–1.20)
GFR: 59.03 mL/min — ABNORMAL LOW (ref >60.00)
Glucose, Bld: 97 mg/dL (ref 70–99)
Potassium: 4.1 mEq/L (ref 3.5–5.1)
Sodium: 136 mEq/L (ref 135–145)
Total Bilirubin: 0.4 mg/dL (ref 0.2–1.2)
Total Protein: 6.9 g/dL (ref 6.0–8.3)

## 2021-07-01 LAB — POCT URINE PREGNANCY: Preg Test, Ur: NEGATIVE

## 2021-07-01 LAB — LIPASE: Lipase: 20 U/L (ref 11.0–59.0)

## 2021-07-01 NOTE — Patient Instructions (Signed)
It was great to see you!  We are going to get blood work and urine studies for further evaluation  Please get the ultrasound as scheduled -- I will be in touch with results  If new/worsening symptoms in the meantime, please go to the ER  Take care,  Inda Coke PA-C
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
## 2021-07-02 ENCOUNTER — Other Ambulatory Visit: Payer: Self-pay | Admitting: Physician Assistant

## 2021-07-02 ENCOUNTER — Ambulatory Visit (HOSPITAL_BASED_OUTPATIENT_CLINIC_OR_DEPARTMENT_OTHER)
Admission: RE | Admit: 2021-07-02 | Discharge: 2021-07-02 | Disposition: A | Discharge status: Home / Self Care | Payer: Federal, State, Local not specified - PPO | Source: Ambulatory Visit | Attending: Physician Assistant | Admitting: Physician Assistant

## 2021-07-02 DIAGNOSIS — R109 Unspecified abdominal pain: Secondary | ICD-10-CM | POA: Insufficient documentation

## 2021-07-02 DIAGNOSIS — K7689 Other specified diseases of liver: Secondary | ICD-10-CM | POA: Diagnosis not present

## 2021-07-02 DIAGNOSIS — K8020 Calculus of gallbladder without cholecystitis without obstruction: Secondary | ICD-10-CM | POA: Diagnosis not present

## 2021-07-02 LAB — H. PYLORI ANTIBODY, IGG: H Pylori IgG: NEGATIVE

## 2021-07-03 ENCOUNTER — Other Ambulatory Visit: Payer: Self-pay

## 2021-07-03 ENCOUNTER — Ambulatory Visit: Payer: Federal, State, Local not specified - PPO | Admitting: Physician Assistant

## 2021-07-03 ENCOUNTER — Ambulatory Visit (HOSPITAL_BASED_OUTPATIENT_CLINIC_OR_DEPARTMENT_OTHER)
Admission: RE | Admit: 2021-07-03 | Discharge: 2021-07-03 | Disposition: A | Discharge status: Home / Self Care | Payer: Federal, State, Local not specified - PPO | Source: Ambulatory Visit | Attending: Physician Assistant | Admitting: Physician Assistant

## 2021-07-03 ENCOUNTER — Encounter (HOSPITAL_BASED_OUTPATIENT_CLINIC_OR_DEPARTMENT_OTHER): Payer: Self-pay

## 2021-07-03 ENCOUNTER — Other Ambulatory Visit: Payer: Self-pay | Admitting: Physician Assistant

## 2021-07-03 DIAGNOSIS — R319 Hematuria, unspecified: Secondary | ICD-10-CM

## 2021-07-03 DIAGNOSIS — N838 Other noninflammatory disorders of ovary, fallopian tube and broad ligament: Secondary | ICD-10-CM

## 2021-07-03 DIAGNOSIS — K8020 Calculus of gallbladder without cholecystitis without obstruction: Secondary | ICD-10-CM | POA: Diagnosis not present

## 2021-07-03 DIAGNOSIS — R109 Unspecified abdominal pain: Secondary | ICD-10-CM | POA: Diagnosis not present

## 2021-07-03 MED ORDER — IOHEXOL 300 MG/ML  SOLN
100.0000 mL | Freq: Once | INTRAMUSCULAR | Status: AC | PRN
  Administered 2021-07-03: 100 mL via INTRAVENOUS

## 2021-07-03 MED ORDER — IBUPROFEN 600 MG PO TABS: 600.0000 mg | ORAL_TABLET | Freq: Three times a day (TID) | ORAL | 0 refills | Status: DC | PRN

## 2021-07-08 ENCOUNTER — Ambulatory Visit: Payer: Federal, State, Local not specified - PPO | Admitting: Obstetrics and Gynecology

## 2021-07-08 ENCOUNTER — Other Ambulatory Visit: Payer: Self-pay

## 2021-07-08 ENCOUNTER — Encounter: Payer: Self-pay | Admitting: Obstetrics and Gynecology

## 2021-07-08 VITALS — BP 140/88 | HR 114 | Ht 63.0 in | Wt 192.0 lb

## 2021-07-08 DIAGNOSIS — N838 Other noninflammatory disorders of ovary, fallopian tube and broad ligament: Secondary | ICD-10-CM

## 2021-07-08 DIAGNOSIS — R1032 Left lower quadrant pain: Secondary | ICD-10-CM

## 2021-07-08 DIAGNOSIS — N83202 Unspecified ovarian cyst, left side: Secondary | ICD-10-CM | POA: Diagnosis not present

## 2021-07-08 NOTE — Progress Notes (Signed)
GYNECOLOGY  VISIT   HPI: 41 y.o.   Single  Caucasian  female   [id number] with Patient's last menstrual period was 06/20/2021 (approximate).   here for abnormal pelvic ultrasound and CT scan. Patient has been having LLQ pain.   Has LLQ pain for 2 weeks.  Can be constant and knife like.  No constipation.  BM twice a day.  No dysuria.  States her PCP found trace blood in her urine and had concerns about potential urinary cause for pain.  See visit on 07/01/21.  Had some some heavy lifting cleaning closet out prior to this.   Pain is worse at night during sleep.  Rx Ibuprofen and hot compress alleviates the pain.   Menses 4 - 7 days.  No bleeding in between cycles.  Can be light or heavy.  Cramps before and after her cycle. Periods are not particularly painful.  Not sexually active for several years.  She is not concerned about sexually transmitted infection.   Hx abdominal myomectomy.   Never had this before.    Narrative & Impression  CLINICAL DATA:  Acute nonlocalized abdominal pain.   Left flank pain for 1 and half weeks.   EXAM: CT ABDOMEN AND PELVIS WITH CONTRAST   TECHNIQUE: Multidetector CT imaging of the abdomen and pelvis was performed using the standard protocol following bolus administration of intravenous contrast.   RADIATION DOSE REDUCTION: This exam was performed according to the departmental dose-optimization program which includes automated exposure control, adjustment of the mA and/or kV according to patient size and/or use of iterative reconstruction technique.   CONTRAST:  18m OMNIPAQUE IOHEXOL 300 MG/ML  SOLN   COMPARISON:  November 24, 2005   FINDINGS: Lower chest: No acute abnormality.   Hepatobiliary: No focal liver abnormality is seen. 3.4 cm laminated gallstone. No gallbladder wall thickening, or biliary dilatation.   Pancreas: Unremarkable. No pancreatic ductal dilatation or surrounding inflammatory changes.   Spleen: Normal in size  without focal abnormality.   Adrenals/Urinary Tract: Adrenal glands are unremarkable. Kidneys are normal, without renal calculi, focal lesion, or hydronephrosis. Bladder is unremarkable.   Stomach/Bowel: Stomach is within normal limits. Appendix appears normal. No evidence of bowel wall thickening, distention, or inflammatory changes.   Vascular/Lymphatic: No significant vascular findings are present. No enlarged abdominal or pelvic lymph nodes.   Reproductive: Normal appearance of the uterus. Likely physiologic 2.7 cm cyst on the left ovary. The right ovary is enlarged measuring 4.9 by 3.9 by 4.3 cm.   Other: No abdominal wall hernia or abnormality. No abdominopelvic ascites.   Musculoskeletal: No acute or significant osseous findings.   IMPRESSION: 1. No evidence of nephrolithiasis or obstructive uropathy. 2. Enlarged right ovary measuring 4.9 cm. Further evaluation with contrast-enhanced MRI or pelvic ultrasound should be considered. 3. 2.7 cm simple appearing left ovarian cyst. No follow-up imaging recommended. Note: This recommendation does not apply to premenarchal patients and to those with increased risk (genetic, family history, elevated tumor markers or other high-risk factors) of ovarian cancer. Reference: JACR 2020 Feb; 17(2):248-254 4. Cholelithiasis without evidence of acute cholecystitis.     Electronically Signed   By: DFidela SalisburyM.D.   On: 07/03/2021 10:05        GYNECOLOGIC HISTORY: Patient's last menstrual period was 06/20/2021 (approximate). Contraception:  Abstinence Menopausal hormone therapy:  none Last mammogram:  Never Last pap smear:    02-25-16 Neg:Neg HR HPV, 09-02-12 Neg:Neg HR HPV         OB History  Gravida  1   Para  1   Term  1   Preterm      AB      Living  1      SAB      IAB      Ectopic      Multiple      Live Births                 Patient Active Problem List   Diagnosis Date Noted    Basal cell carcinoma of back 07/01/2021   Thyroid enlargement     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    Past Surgical History:  Procedure Laterality Date   MYOMECTOMY ABDOMINAL APPROACH  05/2007   THYROID LOBECTOMY     LEFT   Tooth #20 implant      Current Outpatient Medications  Medication Sig Dispense Refill   ibuprofen (ADVIL) 600 MG tablet Take 1 tablet (600 mg total) by mouth every 8 (eight) hours as needed. 30 tablet 0   Multiple Vitamins-Minerals (MULTIVITAMIN ADULTS PO)      Omega-3 Fatty Acids (FISH OIL) 1000 MG CAPS Take 1 capsule by mouth every other day.     No current facility-administered medications for this visit.     ALLERGIES: Patient has no known allergies.  Family History  Problem Relation Age of Onset   Thyroid disease Mother        ?due to accident   Hypertension Father    Hyperlipidemia Father    Heart attack Father    Hypertension Brother    Hyperlipidemia Brother    Diabetes Maternal Aunt    Hypertension Maternal Aunt    Diabetes Maternal Uncle    Hypertension Maternal Uncle    Hypertension Paternal Aunt    Hypertension Paternal Uncle    Diabetes Maternal Grandmother    Stroke Maternal Grandmother    Leukemia Maternal Grandfather    Cancer Maternal Grandfather        lung   Hypertension Maternal Grandfather    Breast cancer Paternal Grandmother        Age 81's   Stroke Paternal Grandfather    Hypertension Paternal Grandfather    Diabetes Cousin    Asthma Cousin     Social History   Socioeconomic History   Marital status: Single    Spouse name: Not on file   Number of children: Not on file   Years of education: Not on file   Highest education level: Not on file  Occupational History   Not on file  Tobacco Use   Smoking status: Former   Smokeless tobacco: Never  Vaping Use   Vaping Use: Never used   Substance and Sexual Activity   Alcohol use: Not Currently    Comment: Rare   Drug use: No   Sexual activity: Not Currently    Partners: Male    Birth control/protection: None  Other Topics Concern   Not on file  Social History Narrative   1 son 2003- [name], mother is [name]   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  All other systems reviewed and are negative.  PHYSICAL EXAMINATION:    BP 140/88    Pulse (!) 114    Ht '5\' 3"'$  (1.6 m)    Wt 192 lb (87.1 kg)    LMP 06/20/2021 (Approximate)    SpO2 97%    BMI 34.01 kg/m     General appearance: alert, cooperative and appears stated age Head: Normocephalic, without obvious abnormality, atraumatic Neck: no adenopathy, suppl. Lungs: clear to auscultation bilaterally Heart: regular rate and rhythm Abdomen: soft, non-tender, no masses,  no organomegaly No abnormal inguinal nodes palpated Neurologic: Grossly normal  Pelvic: External genitalia:  no lesions              Urethra:  normal appearing urethra with no masses, tenderness or lesions              Bartholins and Skenes: normal                 Vagina: normal appearing vagina with normal color and discharge, no lesions              Cervix: no lesions                Bimanual Exam:  Uterus:  normal size, contour, position, consistency, mobility, non-tender              Adnexa: no mass, fullness, tenderness              Rectal exam: yes.  Confirms.              Anus:  normal sphincter tone, no lesions  Chaperone was present for exam:  Estill Bamberg,  CMA  ASSESSMENT  LLQ pain.  Small left ovarian cyst.  Enlarged right ovary.  Hx myomectomy.   PLAN  CT scan reviewed.  We discussed potential gynecologic causes for pain: cysts, fibrosis.  We also reviewed GI and musculoskeletal causes for pain.  Ovarian cysts discussed.  Ok to continue Ibuprofen.   She will return for a pelvic US.    An After Visit Summary was printed and given to the patient.

## 2021-07-10 NOTE — Progress Notes (Signed)
GYNECOLOGY  VISIT ?  ?HPI: ?41 y.o.   Single  Caucasian  female   ?G1P1001 with Patient's last menstrual period was 06/20/2021 (approximate).   ?here to discuss pelvic ultrasound results.  ? ?Had LLQ pain, which has resolved.   ?CT scan showed left ovary with 2.7 cm cyst and an enlarged right ovary measuring 4.9 cm in largest diameter.  ? ?Had pelvic US on 07/11/21.  ? ?GYNECOLOGIC HISTORY: ?Patient's last menstrual period was 06/20/2021 (approximate). ?Contraception:  Abstinence ?Menopausal hormone therapy:  none ?Last mammogram:  NEVER ?Last pap smear:   02-25-16 Neg:Neg HR HPV, 09-02-12 Neg:Neg HR HPV ?       ?OB History   ? ? Gravida  ?1  ? Para  ?1  ? Term  ?1  ? Preterm  ?   ? AB  ?   ? Living  ?1  ?  ? ? SAB  ?   ? IAB  ?   ? Ectopic  ?   ? Multiple  ?   ? Live Births  ?   ?   ?  ?  ?    ? ?Patient Active Problem List  ? Diagnosis Date Noted  ? Basal cell carcinoma of back 07/01/2021  ? Thyroid enlargement   ? ? ?Past Medical History:  ?Diagnosis Date  ? Fibroid   ? uterine  ? History of chicken pox   ? Thyroid disease   ? Thyroid mass 2011  ? ONCOCYTIC ADENOMA--NON MALIGNANT  ? ? ?Past Surgical History:  ?Procedure Laterality Date  ? MYOMECTOMY ABDOMINAL APPROACH  05/2007  ? THYROID LOBECTOMY    ? LEFT  ? Tooth #20 implant    ? ? ?Current Outpatient Medications  ?Medication Sig Dispense Refill  ? ibuprofen (ADVIL) 600 MG tablet Take 1 tablet (600 mg total) by mouth every 8 (eight) hours as needed. 30 tablet 0  ? Multiple Vitamins-Minerals (MULTIVITAMIN ADULTS PO)     ? Omega-3 Fatty Acids (FISH OIL) 1000 MG CAPS Take 1 capsule by mouth every other day.    ? ?No current facility-administered medications for this visit.  ?  ? ?ALLERGIES: Patient has no known allergies. ? ?Family History  ?Problem Relation Age of Onset  ? Thyroid disease Mother   ?     ?due to accident  ? Hypertension Father   ? Hyperlipidemia Father   ? Heart attack Father   ? Hypertension Brother   ? Hyperlipidemia Brother   ? Diabetes Maternal  Aunt   ? Hypertension Maternal Aunt   ? Diabetes Maternal Uncle   ? Hypertension Maternal Uncle   ? Hypertension Paternal Aunt   ? Hypertension Paternal Uncle   ? Diabetes Maternal Grandmother   ? Stroke Maternal Grandmother   ? Leukemia Maternal Grandfather   ? Cancer Maternal Grandfather   ?     lung  ? Hypertension Maternal Grandfather   ? Breast cancer Paternal Grandmother   ?     Age 65's  ? Stroke Paternal Grandfather   ? Hypertension Paternal Grandfather   ? Diabetes Cousin   ? Asthma Cousin   ? ? ?Social History  ? ?Socioeconomic History  ? Marital status: Single  ?  Spouse name: Not on file  ? Number of children: Not on file  ? Years of education: Not on file  ? Highest education level: Not on file  ?Occupational History  ? Not on file  ?Tobacco Use  ? Smoking status: Former  ? Smokeless  tobacco: Never  ?Vaping Use  ? Vaping Use: Never used  ?Substance and Sexual Activity  ? Alcohol use: Not Currently  ?  Comment: Rare  ? Drug use: No  ? Sexual activity: Not Currently  ?  Partners: Male  ?  Birth control/protection: None  ?Other Topics Concern  ? Not on file  ?Social History Narrative  ? 1 son 2003- [name], mother is [name]  ? 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 Single  ? Enjoys sleeping, watching her son's baseball  ? ?Social Determinants of Health  ? ?Financial Resource Strain: Not on file  ?Food Insecurity: Not on file  ?Transportation Needs: Not on file  ?Physical Activity: Not on file  ?Stress: Not on file  ?Social Connections: Not on file  ?Intimate Partner Violence: Not on file  ? ? ?Review of Systems  ?All other systems reviewed and are negative. ? ?PHYSICAL EXAMINATION:   ? ?BP (!) 150/90 (BP Location: Left Arm, Patient Position: Sitting, Cuff Size: Large)   Pulse (!) 106   LMP 06/20/2021 (Approximate)     ?General appearance: alert, cooperative and appears stated age ?  ?Pelvic US ?Uterus 9.78 x 5.87 x 6.87 cm. ?EMS 9.5 mm.  ?Right ovary 2.54 x 1.39 x 1.67 cm.   ?Right adnexal mass 5.41 x 3.45 x 4.43 cm.  ? Fibroid. ?Left ovary 2.52 x 1.55 x 1.95 cm.  ?Small free fluid. ? ?ASSESSMENT ? ?Right adnexal mass.  ?Hx myomectomy.  ?Left ovarian cyst resolved.  ? ?PLAN ? ?Pelvic US findings and images reviewed.  ?Will proceed with pelvic MRI to define adnexal mass further.  ?If is a fibroid, no further evaluation or treatment is needed.  ?  ?An After Visit Summary was printed and given to the patient. ? ?25 min  total time was spent for this patient encounter, including preparation, face-to-face counseling with the patient, coordination of care, and documentation of the encounter. ? ? ?

## 2021-07-11 ENCOUNTER — Other Ambulatory Visit: Payer: Self-pay

## 2021-07-11 ENCOUNTER — Ambulatory Visit: Payer: Federal, State, Local not specified - PPO

## 2021-07-11 DIAGNOSIS — N83202 Unspecified ovarian cyst, left side: Secondary | ICD-10-CM

## 2021-07-11 DIAGNOSIS — N838 Other noninflammatory disorders of ovary, fallopian tube and broad ligament: Secondary | ICD-10-CM | POA: Diagnosis not present

## 2021-07-11 DIAGNOSIS — R1032 Left lower quadrant pain: Secondary | ICD-10-CM

## 2021-07-15 ENCOUNTER — Telehealth: Payer: Self-pay | Admitting: Obstetrics and Gynecology

## 2021-07-15 ENCOUNTER — Encounter: Payer: Self-pay | Admitting: Obstetrics and Gynecology

## 2021-07-15 ENCOUNTER — Other Ambulatory Visit: Payer: Self-pay

## 2021-07-15 ENCOUNTER — Ambulatory Visit: Payer: Federal, State, Local not specified - PPO | Admitting: Obstetrics and Gynecology

## 2021-07-15 VITALS — BP 150/90 | HR 106

## 2021-07-15 DIAGNOSIS — N9489 Other specified conditions associated with female genital organs and menstrual cycle: Secondary | ICD-10-CM | POA: Diagnosis not present

## 2021-07-15 NOTE — Telephone Encounter (Signed)
Please precert and schedule pelvic MRI at [facility].  ? ?Patient has a solid right adnexal mass not clearly defined on pelvic US or CT scan.  ?

## 2021-07-15 NOTE — Telephone Encounter (Signed)
Pelvic MRI referral order placed in Epic. Patient will call to schedule at [phone] prompt 1 then prompt 3. ? ?I called patient to let her know order placed and provide this info but her voice mailbox is full and I cannot leave a message. ?

## 2021-07-18 NOTE — Telephone Encounter (Signed)
Called patient again. Voice mail not set up. I sent her a My Chart message informing her she can call [facility] and schedule the MRI as order has been placed. Phone number and prompts provided. I asked her to call me with date/time so we can send to [facility] to check if PA needed. ?

## 2021-07-24 NOTE — Telephone Encounter (Signed)
Patient has not read My Chart message. [facility] has tried to call and schedule her MRI but voice mail box is full. ? ?I called her today but once again her voice mail box is full. ? ?Letter sent to call me. ?

## 2021-07-31 NOTE — Telephone Encounter (Signed)
Patient has not returned calls to [place] or to our office/me.  I even sent a letter asking her to call me.  Will transfer this to Dr. Quincy Simmonds as I want her to be aware. ?

## 2021-08-13 NOTE — Telephone Encounter (Signed)
Upon review of chart patient did read My Chart message I sent her. "Last read by Benson Setting at  2:45 PM on 07/26/2021". A letter was also sent through the mail. ? ?It advised her that order has been placed and provided her with the phone number to call to schedule pelvic MRI. ? ?[facility] has tried to call several times to arrange but her voice mail box is full. ?

## 2021-08-19 NOTE — Telephone Encounter (Signed)
Thank you for sending this letter to the patient.  ?

## 2021-08-19 NOTE — Telephone Encounter (Signed)
Encounter closed

## 2021-08-19 NOTE — Telephone Encounter (Signed)
Please send letter to patient recommending additional imaging for enlarging right adnexal mass.  ?The etiology of the mass has not been well defined through prior imaging with a CT scan and an office pelvic ultrasound.  ?The mass may be due to a fibroid, adnexal cyst, or a potential precanceous or cancerous tumor.  ? ?I recommend that the patient proceed with a follow up ultrasound at [place] or pelvic MRI at [facility].  ? ?This communication is not meant to create fear, but it is to educate the patient that she does not yet have a clear diagnosis, and that lack of follow up may affect her health.  ? ?Ultimately, it will be her choice how she would like to proceed with her care.  ? ? ? ? ?

## 2021-08-19 NOTE — Telephone Encounter (Signed)
Letter mailed to patient. Copy in in chart. ?

## 2021-10-22 DIAGNOSIS — K080 Exfoliation of teeth due to systemic causes: Secondary | ICD-10-CM | POA: Diagnosis not present

## 2022-01-27 ENCOUNTER — Encounter: Payer: Self-pay | Admitting: *Deleted

## 2022-04-17 ENCOUNTER — Encounter: Payer: Self-pay | Admitting: *Deleted

## 2022-05-23 NOTE — Telephone Encounter (Signed)
MRI pelvis not scheduled to date.  OV 07/15/21 for adnexal mass.  [facility] contacted x2.  MyChart message to patient, Last read by Benson Setting at  2:45 PM on 07/26/2021.  No future AEX scheduled.   Dr. Quincy Simmonds -please review and advise.

## 2022-05-27 NOTE — Telephone Encounter (Signed)
Letter reviewed and signed by Dr. Quincy Simmonds.  Mailed to address on file.  Encounter closed.

## 2022-05-27 NOTE — Telephone Encounter (Signed)
Nunzio Cobbs, MD  You4 hours ago (8:13 AM)    Please send letter to patient recommending the MRI imaging to define the adnexal mass.  You may close the order once the letter is sent.  I would open up a new order if she responds.  Thank you,  [place]    Letter pended. Copy to Dr. Quincy Simmonds to review.   MRI order cancelled.

## 2022-07-28 DIAGNOSIS — M5412 Radiculopathy, cervical region: Secondary | ICD-10-CM | POA: Diagnosis not present

## 2022-07-28 DIAGNOSIS — M9901 Segmental and somatic dysfunction of cervical region: Secondary | ICD-10-CM | POA: Diagnosis not present

## 2022-07-28 DIAGNOSIS — M9902 Segmental and somatic dysfunction of thoracic region: Secondary | ICD-10-CM | POA: Diagnosis not present

## 2022-07-28 DIAGNOSIS — M9903 Segmental and somatic dysfunction of lumbar region: Secondary | ICD-10-CM | POA: Diagnosis not present

## 2022-07-30 DIAGNOSIS — M9901 Segmental and somatic dysfunction of cervical region: Secondary | ICD-10-CM | POA: Diagnosis not present

## 2022-07-30 DIAGNOSIS — M9903 Segmental and somatic dysfunction of lumbar region: Secondary | ICD-10-CM | POA: Diagnosis not present

## 2022-07-30 DIAGNOSIS — M5412 Radiculopathy, cervical region: Secondary | ICD-10-CM | POA: Diagnosis not present

## 2022-07-30 DIAGNOSIS — M9902 Segmental and somatic dysfunction of thoracic region: Secondary | ICD-10-CM | POA: Diagnosis not present

## 2022-07-31 DIAGNOSIS — M9901 Segmental and somatic dysfunction of cervical region: Secondary | ICD-10-CM | POA: Diagnosis not present

## 2022-07-31 DIAGNOSIS — M5412 Radiculopathy, cervical region: Secondary | ICD-10-CM | POA: Diagnosis not present

## 2022-07-31 DIAGNOSIS — M9903 Segmental and somatic dysfunction of lumbar region: Secondary | ICD-10-CM | POA: Diagnosis not present

## 2022-07-31 DIAGNOSIS — M9902 Segmental and somatic dysfunction of thoracic region: Secondary | ICD-10-CM | POA: Diagnosis not present

## 2022-08-06 DIAGNOSIS — M9903 Segmental and somatic dysfunction of lumbar region: Secondary | ICD-10-CM | POA: Diagnosis not present

## 2022-08-06 DIAGNOSIS — M5412 Radiculopathy, cervical region: Secondary | ICD-10-CM | POA: Diagnosis not present

## 2022-08-06 DIAGNOSIS — M9902 Segmental and somatic dysfunction of thoracic region: Secondary | ICD-10-CM | POA: Diagnosis not present

## 2022-08-06 DIAGNOSIS — M9901 Segmental and somatic dysfunction of cervical region: Secondary | ICD-10-CM | POA: Diagnosis not present

## 2022-11-10 ENCOUNTER — Other Ambulatory Visit (HOSPITAL_BASED_OUTPATIENT_CLINIC_OR_DEPARTMENT_OTHER): Payer: Self-pay | Admitting: Physician Assistant

## 2022-11-10 DIAGNOSIS — Z1231 Encounter for screening mammogram for malignant neoplasm of breast: Secondary | ICD-10-CM

## 2022-11-11 ENCOUNTER — Encounter (HOSPITAL_BASED_OUTPATIENT_CLINIC_OR_DEPARTMENT_OTHER): Payer: Self-pay

## 2022-11-11 ENCOUNTER — Ambulatory Visit (HOSPITAL_BASED_OUTPATIENT_CLINIC_OR_DEPARTMENT_OTHER)
Admission: RE | Admit: 2022-11-11 | Discharge: 2022-11-11 | Disposition: A | Discharge status: Home / Self Care | Payer: Federal, State, Local not specified - PPO | Source: Ambulatory Visit | Attending: Physician Assistant | Admitting: Physician Assistant

## 2022-11-11 DIAGNOSIS — Z1231 Encounter for screening mammogram for malignant neoplasm of breast: Secondary | ICD-10-CM | POA: Diagnosis not present

## 2022-11-13 ENCOUNTER — Other Ambulatory Visit: Payer: Self-pay | Admitting: Physician Assistant

## 2022-11-13 DIAGNOSIS — R928 Other abnormal and inconclusive findings on diagnostic imaging of breast: Secondary | ICD-10-CM

## 2022-11-20 ENCOUNTER — Other Ambulatory Visit: Payer: Federal, State, Local not specified - PPO

## 2022-12-18 IMAGING — US US ABDOMEN COMPLETE
1 series · 14 of 25 positions shown · non-contrast
Comparison: CT abdomen 11/24/2005

CLINICAL DATA: Left flank pain

EXAM:
ABDOMEN ULTRASOUND COMPLETE

[Series 1: us abdomen complete · 14 of 75 slices shown]
[im 1/75]
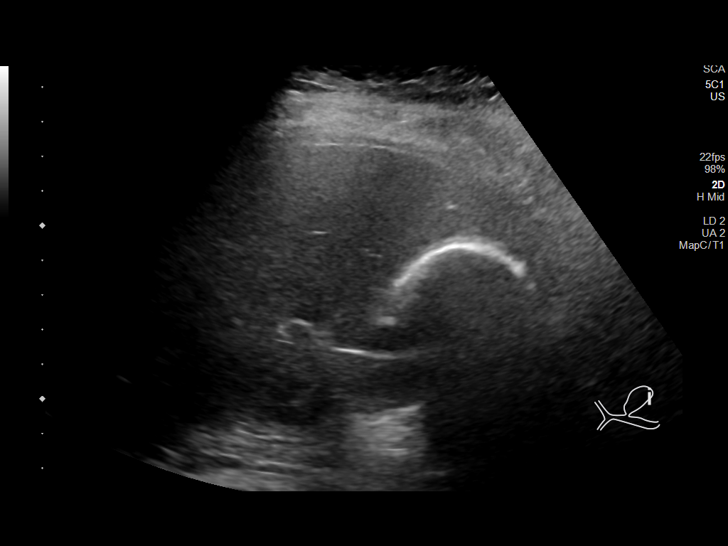
[im 7/75]
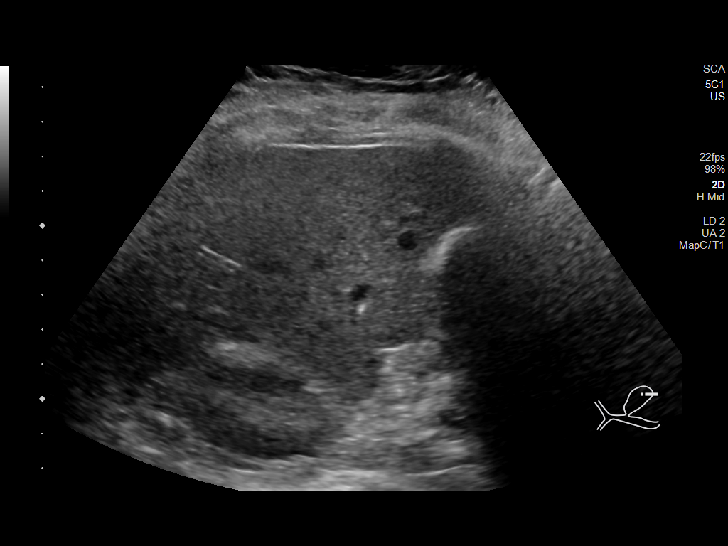
[im 13/75]
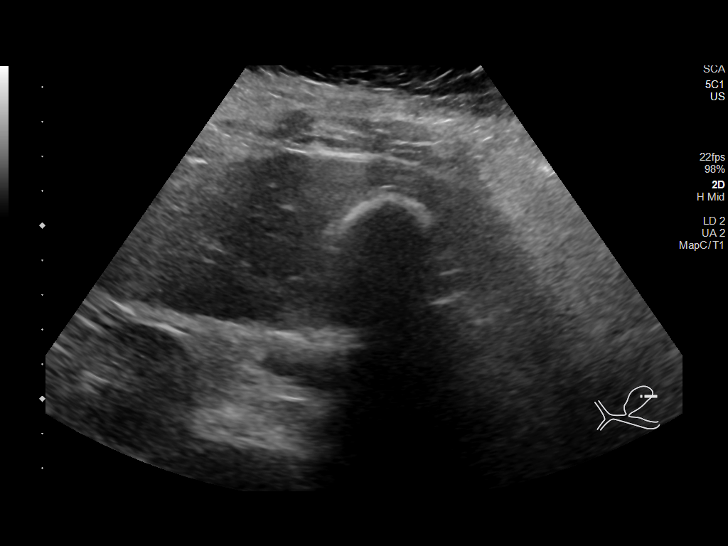
[im 19/75]
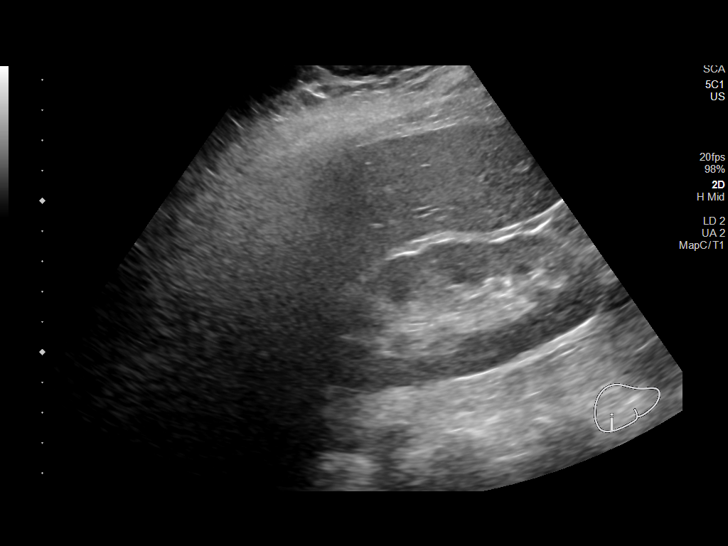
[im 25/75]
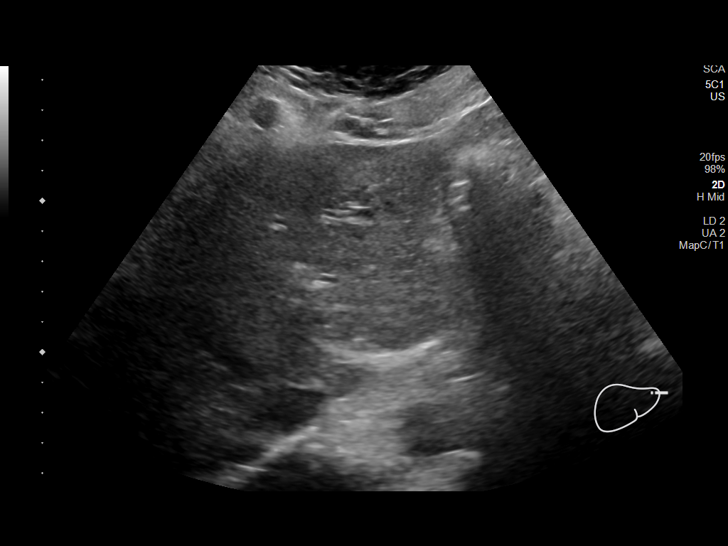
[im 28/75]
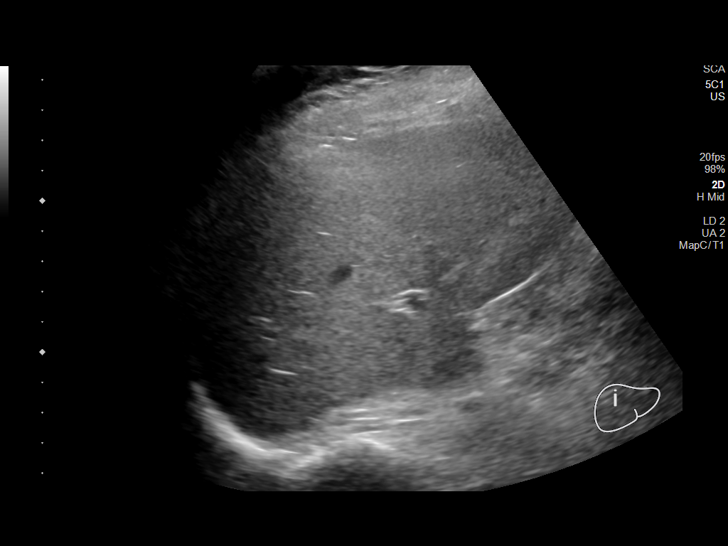
[im 34/75]
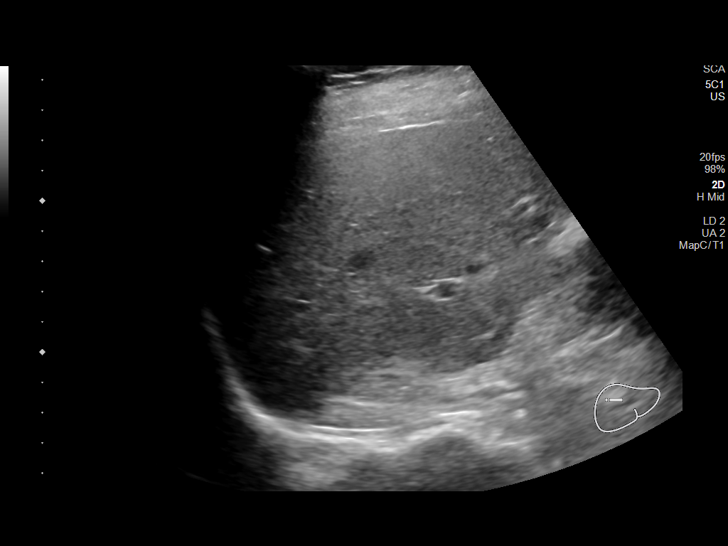
[im 41/75]
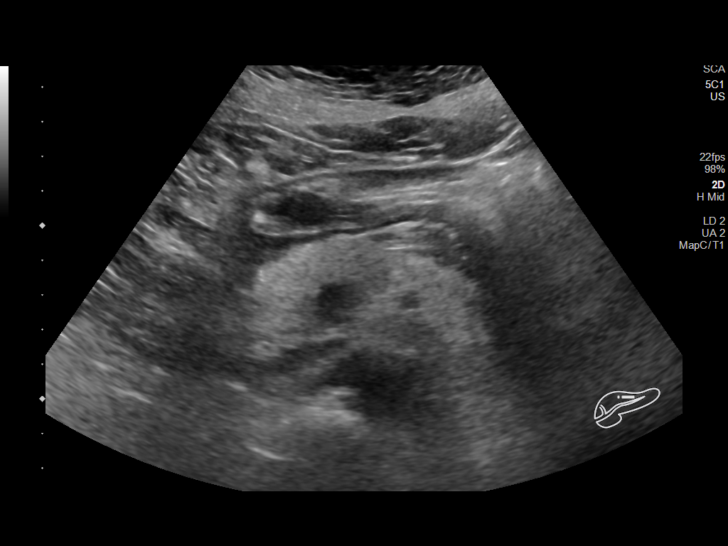
[im 47/75]
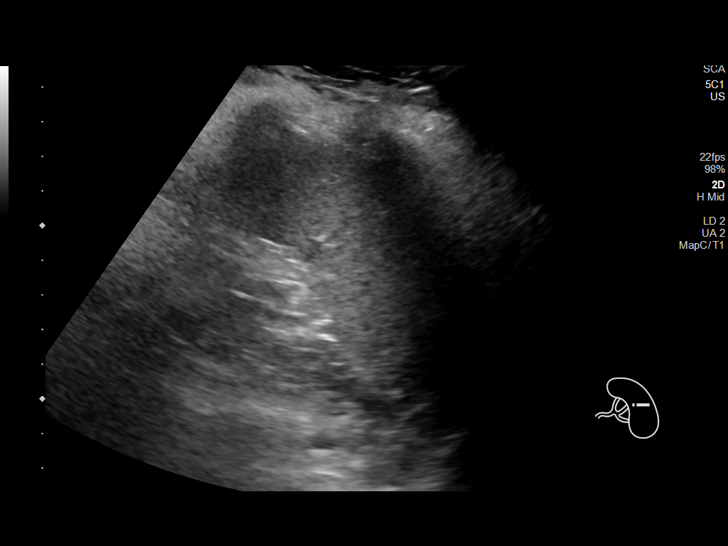
[im 50/75]
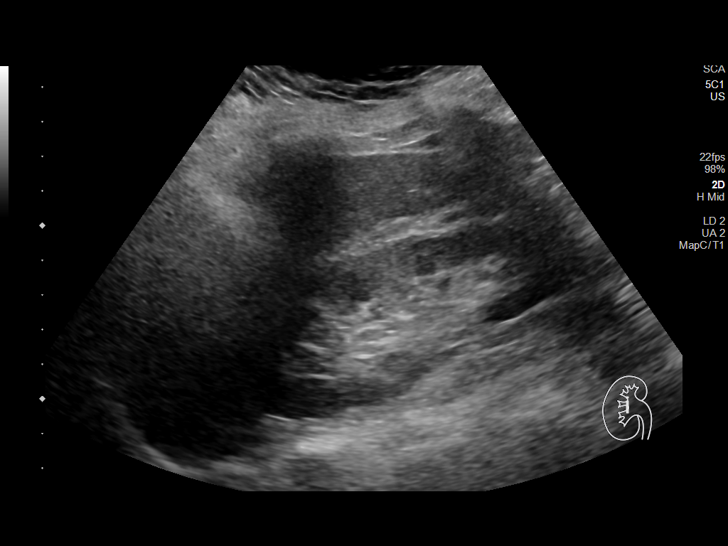
[im 56/75]
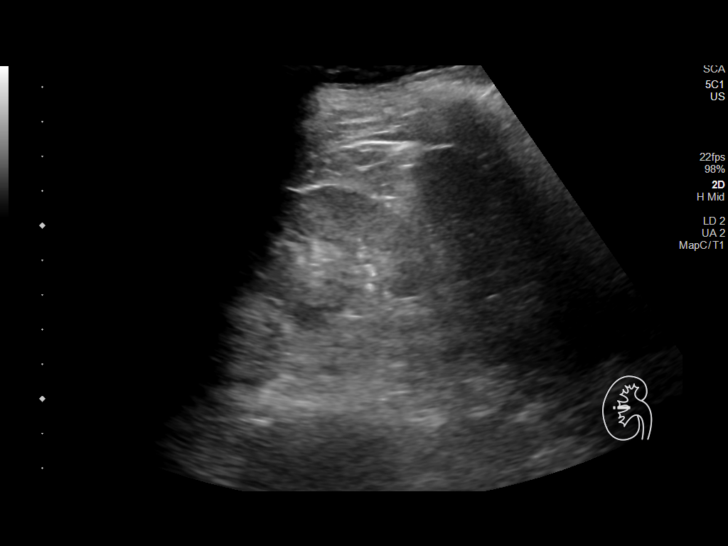
[im 62/75]
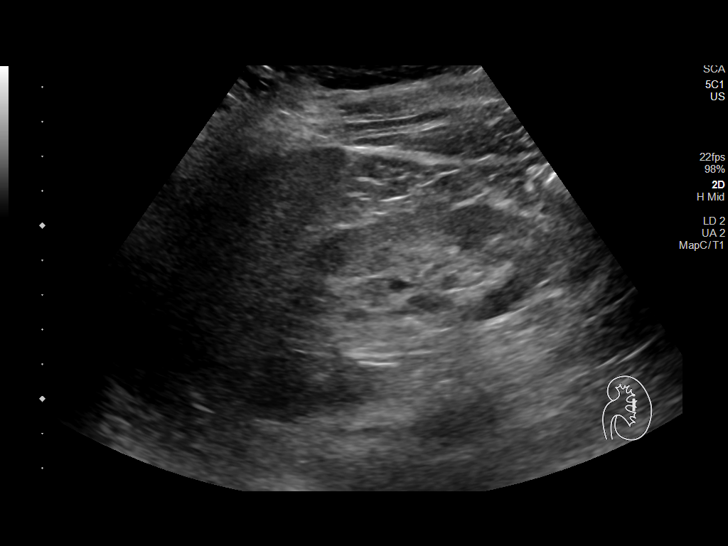
[im 68/75]
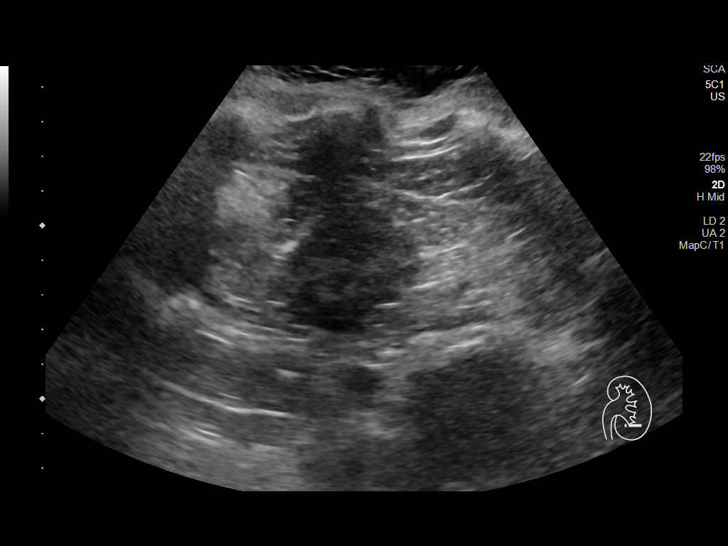
[im 75/75]
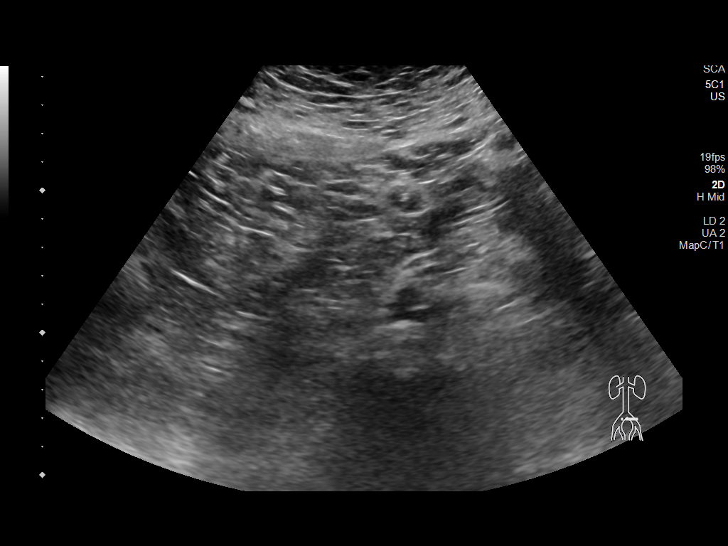

[14 of 25 positions shown; findings below may reference images not displayed]

FINDINGS: Gallbladder: Cholelithiasis. No pericholecystic fluid or gallbladder
wall thickening. Negative sonographic Murphy sign. Largest gallstone
measures 4 cm.

Common bile duct: Diameter: 3.1 mm

Liver: No focal lesion identified. Increased hepatic parenchymal
echogenicity. Portal vein is patent on color Doppler imaging with
normal direction of blood flow towards the liver.

IVC: No abnormality visualized.

Pancreas: Visualized portion unremarkable.

Spleen: Size and appearance within normal limits.

Right Kidney: Length: 10.4 cm. Echogenicity within normal limits. No
mass or hydronephrosis visualized.

Left Kidney: Length: 10.9 cm. Echogenicity within normal limits. No
mass or hydronephrosis visualized.

Abdominal aorta: No aneurysm visualized.

Other findings: None.
IMPRESSION: 1. Cholelithiasis without sonographic evidence of acute
cholecystitis.
2. Increased hepatic echogenicity as can be seen with hepatic
steatosis.

## 2022-12-19 IMAGING — CT CT ABD-PELV W/ CM
2 of 5 series · 16 of 46 positions shown, 18 images · IV contrast (Omnipaque)
Comparison: November 24, 2005

CLINICAL DATA: Acute nonlocalized abdominal pain.

Left flank pain for 1 and half weeks.
EXAM:
CT ABDOMEN AND PELVIS WITH CONTRAST
TECHNIQUE: Multidetector CT imaging of the abdomen and pelvis was performed
using the standard protocol following bolus administration of
intravenous contrast.

[Series 2: axial st · axial · 0.88mm/px · z∈[-639,-189]mm · 13 of 102 slices shown, 15 images]
[im 6/102  soft-tissue]
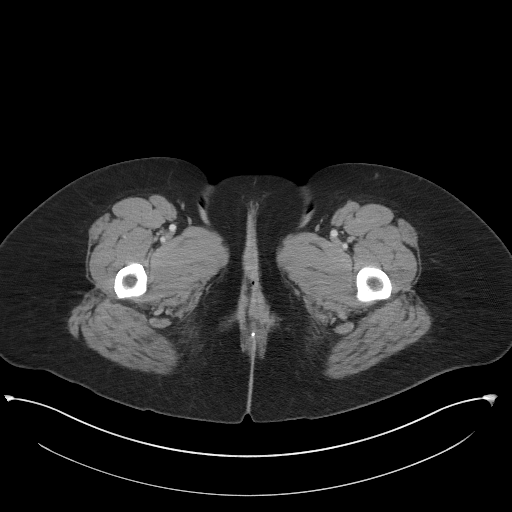
[im 6/102  bone]
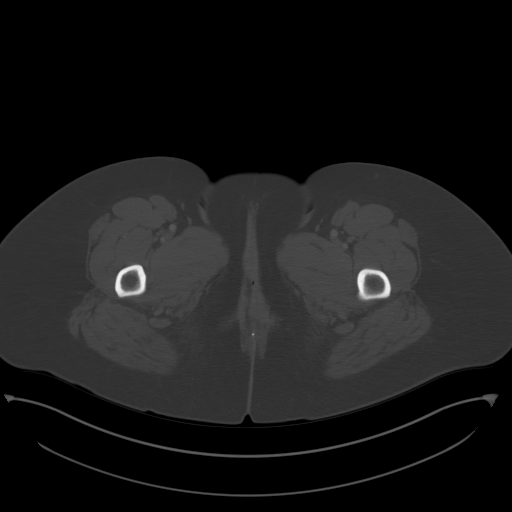
[im 12/102  soft-tissue]
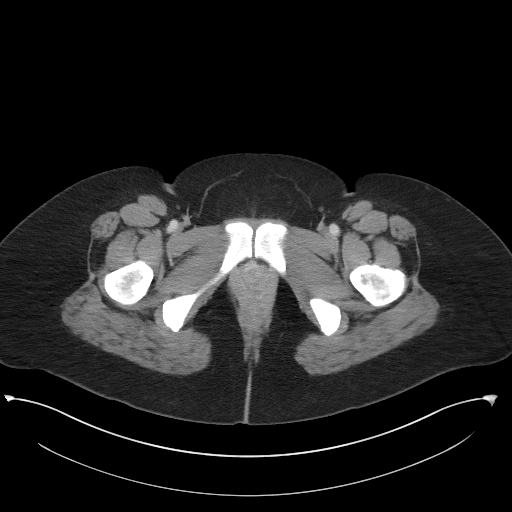
[im 24/102  soft-tissue]
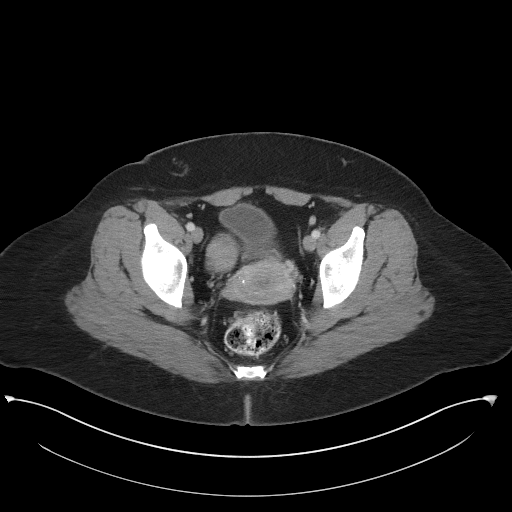
[im 30/102  soft-tissue]
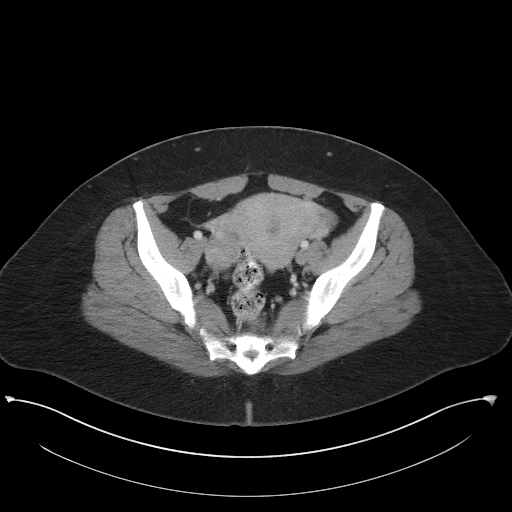
[im 36/102  soft-tissue]
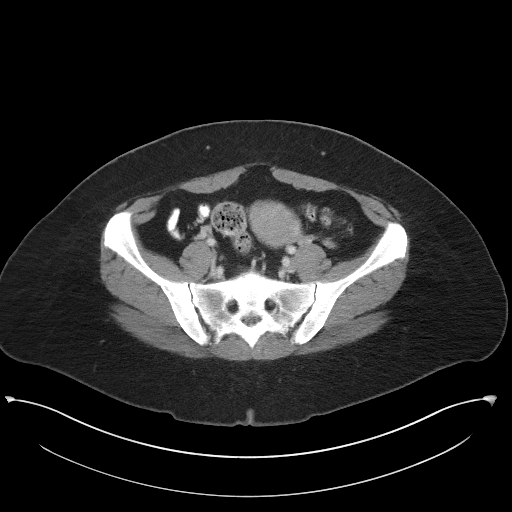
[im 42/102  soft-tissue]
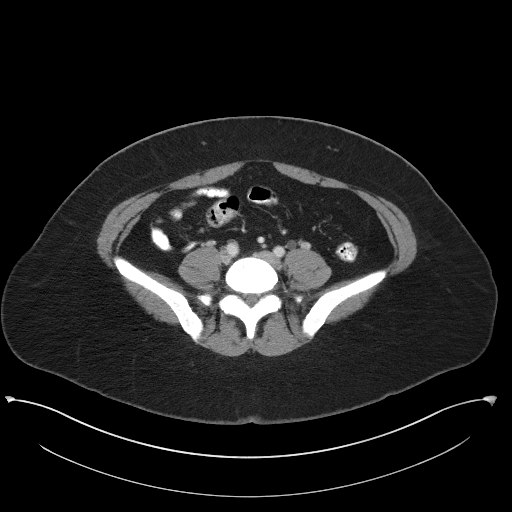
[im 54/102  soft-tissue]
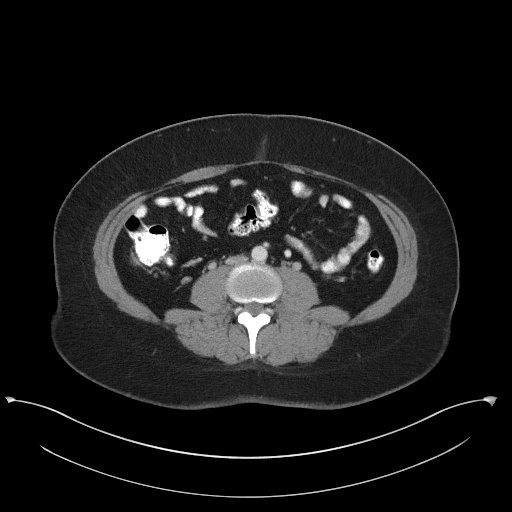
[im 60/102  soft-tissue]
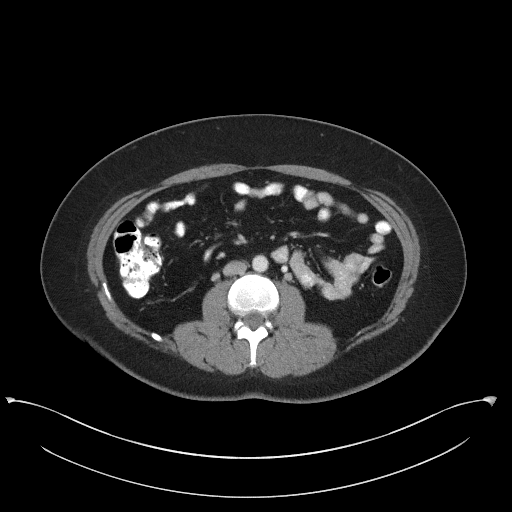
[im 66/102  soft-tissue]
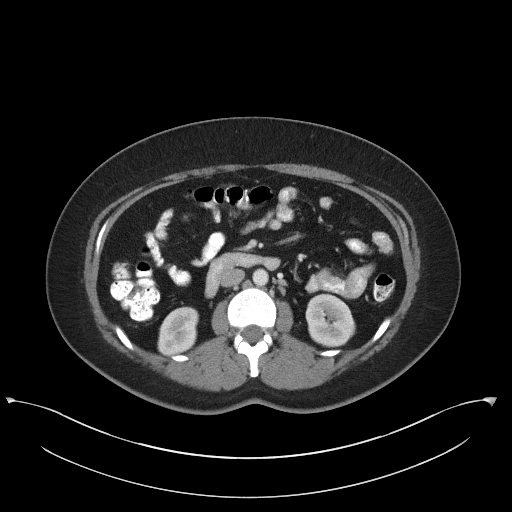
[im 66/102  bone]
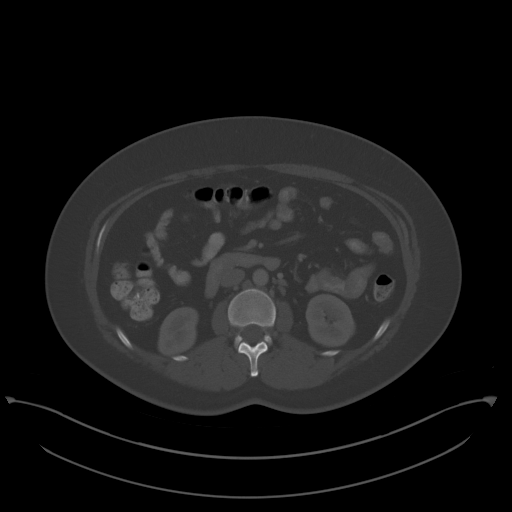
[im 72/102  soft-tissue]
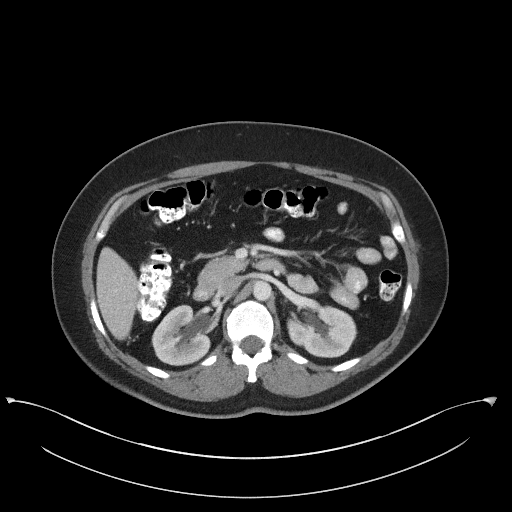
[im 78/102  soft-tissue]
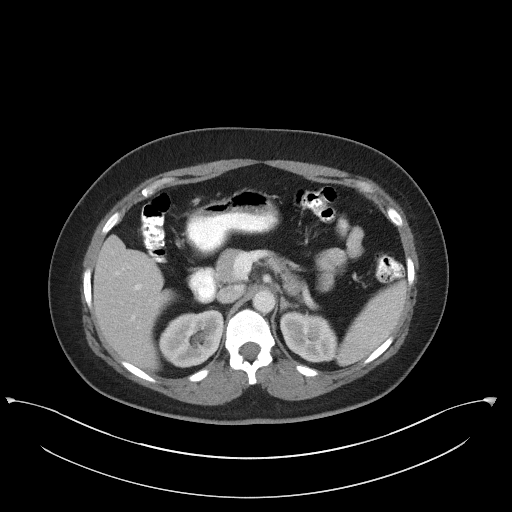
[im 90/102  soft-tissue]
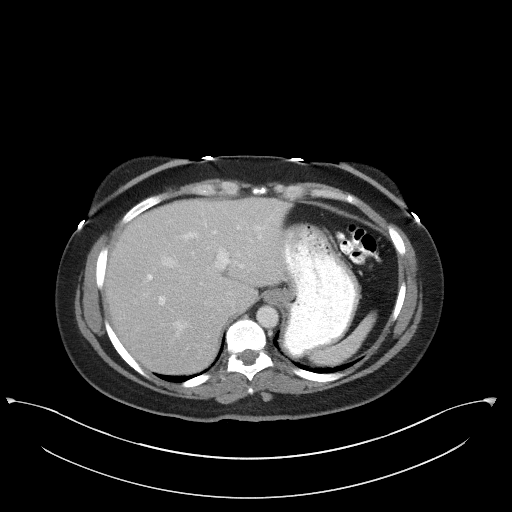
[im 96/102  soft-tissue]
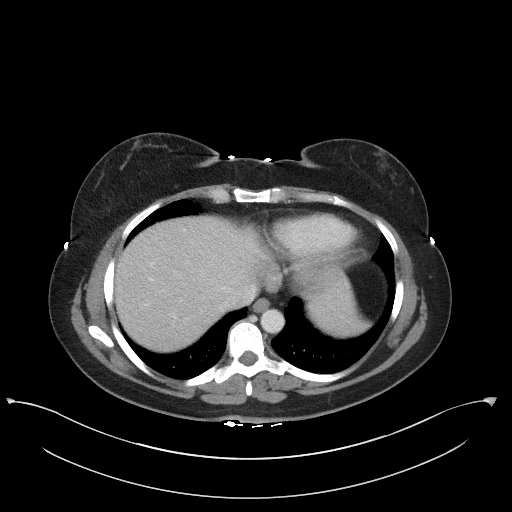

[Series 5: coronal st · coronal · 0.87mm/px · 3 of 93 slices shown]
[im 31/93  soft-tissue]
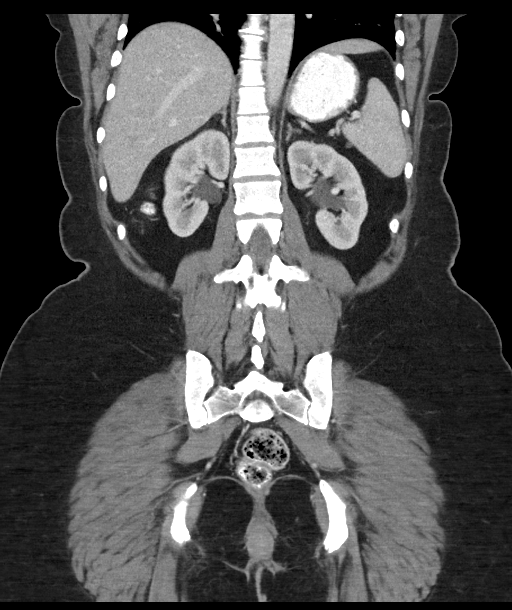
[im 41/93  soft-tissue]
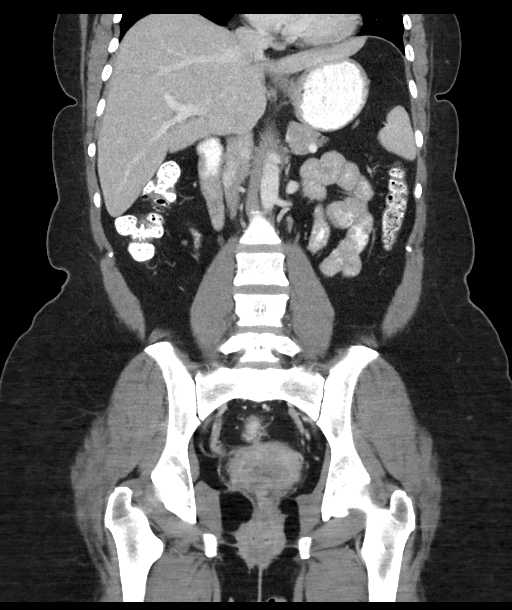
[im 52/93  soft-tissue]
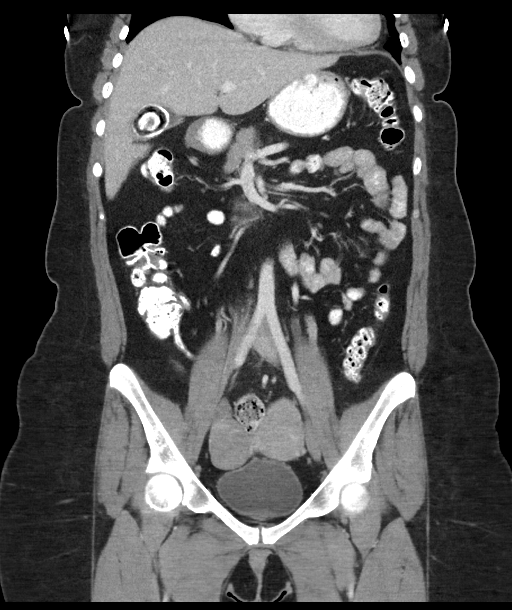

[16 of 46 positions shown; findings below may reference images not displayed]

RADIATION DOSE REDUCTION: This exam was performed according to the
departmental dose-optimization program which includes automated
exposure control, adjustment of the mA and/or kV according to
patient size and/or use of iterative reconstruction technique.

CONTRAST:  100mL OMNIPAQUE IOHEXOL 300 MG/ML  SOLN
FINDINGS: Lower chest: No acute abnormality.

Hepatobiliary: No focal liver abnormality is seen. 3.4 cm laminated
gallstone. No gallbladder wall thickening, or biliary dilatation.

Pancreas: Unremarkable. No pancreatic ductal dilatation or
surrounding inflammatory changes.

Spleen: Normal in size without focal abnormality.

Adrenals/Urinary Tract: Adrenal glands are unremarkable. Kidneys are
normal, without renal calculi, focal lesion, or hydronephrosis.
Bladder is unremarkable.

Stomach/Bowel: Stomach is within normal limits. Appendix appears
normal. No evidence of bowel wall thickening, distention, or
inflammatory changes.

Vascular/Lymphatic: No significant vascular findings are present. No
enlarged abdominal or pelvic lymph nodes.

Reproductive: Normal appearance of the uterus. Likely physiologic
2.7 cm cyst on the left ovary. The right ovary is enlarged measuring
4.9 by 3.9 by 4.3 cm.

Other: No abdominal wall hernia or abnormality. No abdominopelvic
ascites.

Musculoskeletal: No acute or significant osseous findings.
IMPRESSION: 1. No evidence of nephrolithiasis or obstructive uropathy.
2. Enlarged right ovary measuring 4.9 cm. Further evaluation with
contrast-enhanced MRI or pelvic ultrasound should be considered.
3. 2.7 cm simple appearing left ovarian cyst. No follow-up imaging
recommended. Note: This recommendation does not apply to
premenarchal patients and to those with increased risk (genetic,
family history, elevated tumor markers or other high-risk factors)
of ovarian cancer. Reference: JACR [DATE]):248-254
4. Cholelithiasis without evidence of acute cholecystitis.

## 2023-04-09 DIAGNOSIS — M25511 Pain in right shoulder: Secondary | ICD-10-CM | POA: Insufficient documentation

## 2023-04-13 DIAGNOSIS — M9903 Segmental and somatic dysfunction of lumbar region: Secondary | ICD-10-CM | POA: Diagnosis not present

## 2023-04-13 DIAGNOSIS — M9901 Segmental and somatic dysfunction of cervical region: Secondary | ICD-10-CM | POA: Diagnosis not present

## 2023-04-13 DIAGNOSIS — M5412 Radiculopathy, cervical region: Secondary | ICD-10-CM | POA: Diagnosis not present

## 2023-04-13 DIAGNOSIS — M9902 Segmental and somatic dysfunction of thoracic region: Secondary | ICD-10-CM | POA: Diagnosis not present

## 2023-04-15 DIAGNOSIS — M5412 Radiculopathy, cervical region: Secondary | ICD-10-CM | POA: Diagnosis not present

## 2023-04-15 DIAGNOSIS — M9903 Segmental and somatic dysfunction of lumbar region: Secondary | ICD-10-CM | POA: Diagnosis not present

## 2023-04-15 DIAGNOSIS — M9901 Segmental and somatic dysfunction of cervical region: Secondary | ICD-10-CM | POA: Diagnosis not present

## 2023-04-15 DIAGNOSIS — M9902 Segmental and somatic dysfunction of thoracic region: Secondary | ICD-10-CM | POA: Diagnosis not present

## 2023-06-03 ENCOUNTER — Encounter: Payer: Federal, State, Local not specified - PPO | Admitting: Physician Assistant

## 2023-06-29 ENCOUNTER — Encounter: Payer: Federal, State, Local not specified - PPO | Admitting: Physician Assistant

## 2023-06-30 ENCOUNTER — Observation Stay (HOSPITAL_BASED_OUTPATIENT_CLINIC_OR_DEPARTMENT_OTHER)
Admission: EM | Admit: 2023-06-30 | Discharge: 2023-07-02 | Disposition: A | Discharge status: Home / Self Care | Payer: Federal, State, Local not specified - PPO | Attending: Internal Medicine | Admitting: Internal Medicine

## 2023-06-30 ENCOUNTER — Ambulatory Visit: Payer: Federal, State, Local not specified - PPO | Admitting: Physician Assistant

## 2023-06-30 ENCOUNTER — Encounter (HOSPITAL_BASED_OUTPATIENT_CLINIC_OR_DEPARTMENT_OTHER): Payer: Self-pay | Admitting: Emergency Medicine

## 2023-06-30 ENCOUNTER — Other Ambulatory Visit: Payer: Self-pay

## 2023-06-30 ENCOUNTER — Encounter: Payer: Self-pay | Admitting: Physician Assistant

## 2023-06-30 VITALS — BP 214/150 | HR 97 | Temp 98.2°F | Ht 63.0 in | Wt 178.0 lb

## 2023-06-30 DIAGNOSIS — Z87891 Personal history of nicotine dependence: Secondary | ICD-10-CM | POA: Insufficient documentation

## 2023-06-30 DIAGNOSIS — I160 Hypertensive urgency: Secondary | ICD-10-CM

## 2023-06-30 DIAGNOSIS — I10 Essential (primary) hypertension: Secondary | ICD-10-CM | POA: Diagnosis not present

## 2023-06-30 DIAGNOSIS — Z79899 Other long term (current) drug therapy: Secondary | ICD-10-CM | POA: Insufficient documentation

## 2023-06-30 DIAGNOSIS — N179 Acute kidney failure, unspecified: Secondary | ICD-10-CM | POA: Diagnosis not present

## 2023-06-30 DIAGNOSIS — Z0001 Encounter for general adult medical examination with abnormal findings: Secondary | ICD-10-CM | POA: Diagnosis not present

## 2023-06-30 DIAGNOSIS — M25511 Pain in right shoulder: Secondary | ICD-10-CM

## 2023-06-30 DIAGNOSIS — I161 Hypertensive emergency: Secondary | ICD-10-CM | POA: Diagnosis not present

## 2023-06-30 DIAGNOSIS — N838 Other noninflammatory disorders of ovary, fallopian tube and broad ligament: Secondary | ICD-10-CM | POA: Diagnosis not present

## 2023-06-30 DIAGNOSIS — Z0000 Encounter for general adult medical examination without abnormal findings: Secondary | ICD-10-CM

## 2023-06-30 DIAGNOSIS — R928 Other abnormal and inconclusive findings on diagnostic imaging of breast: Secondary | ICD-10-CM

## 2023-06-30 LAB — BASIC METABOLIC PANEL
Anion gap: 10 (ref 5–15)
BUN: 14 mg/dL (ref 6–20)
CO2: 28 mmol/L (ref 22–32)
Calcium: 9 mg/dL (ref 8.9–10.3)
Chloride: 101 mmol/L (ref 98–111)
Creatinine, Ser: 1.88 mg/dL — ABNORMAL HIGH (ref 0.44–1.00)
GFR, Estimated: 34 mL/min — ABNORMAL LOW (ref >60)
Glucose, Bld: 96 mg/dL (ref 70–99)
Potassium: 3.2 mmol/L — ABNORMAL LOW (ref 3.5–5.1)
Sodium: 139 mmol/L (ref 135–145)

## 2023-06-30 LAB — URINALYSIS, ROUTINE W REFLEX MICROSCOPIC
Bilirubin Urine: NEGATIVE
Glucose, UA: NEGATIVE mg/dL
Ketones, ur: NEGATIVE mg/dL
Leukocytes,Ua: NEGATIVE
Nitrite: NEGATIVE
Protein, ur: >300 mg/dL — AB
Specific Gravity, Urine: 1.017 (ref 1.005–1.030)
pH: 6 (ref 5.0–8.0)

## 2023-06-30 LAB — CBC WITH DIFFERENTIAL/PLATELET
Abs Immature Granulocytes: 0.04 10*3/uL (ref 0.00–0.07)
Basophils Absolute: 0.1 10*3/uL (ref 0.0–0.1)
Basophils Relative: 1 %
Eosinophils Absolute: 0.2 10*3/uL (ref 0.0–0.5)
Eosinophils Relative: 2 %
HCT: 37.5 % (ref 36.0–46.0)
Hemoglobin: 12.5 g/dL (ref 12.0–15.0)
Immature Granulocytes: 0 %
Lymphocytes Relative: 18 %
Lymphs Abs: 2.4 10*3/uL (ref 0.7–4.0)
MCH: 26.8 pg (ref 26.0–34.0)
MCHC: 33.3 g/dL (ref 30.0–36.0)
MCV: 80.5 fL (ref 80.0–100.0)
Monocytes Absolute: 0.5 10*3/uL (ref 0.1–1.0)
Monocytes Relative: 4 %
Neutro Abs: 10.4 10*3/uL — ABNORMAL HIGH (ref 1.7–7.7)
Neutrophils Relative %: 75 %
Platelets: 484 10*3/uL — ABNORMAL HIGH (ref 150–400)
RBC: 4.66 MIL/uL (ref 3.87–5.11)
RDW: 13.5 % (ref 11.5–15.5)
WBC: 13.6 10*3/uL — ABNORMAL HIGH (ref 4.0–10.5)
nRBC: 0 % (ref 0.0–0.2)

## 2023-06-30 LAB — TROPONIN I (HIGH SENSITIVITY): Troponin I (High Sensitivity): 8 ng/L (ref <18)

## 2023-06-30 LAB — T4, FREE: Free T4: 0.85 ng/dL (ref 0.61–1.12)

## 2023-06-30 LAB — HCG, SERUM, QUALITATIVE: Preg, Serum: NEGATIVE

## 2023-06-30 LAB — TSH: TSH: 2.679 u[IU]/mL (ref 0.350–4.500)

## 2023-06-30 LAB — OSMOLALITY: Osmolality: 289 mosm/kg (ref 275–295)

## 2023-06-30 MED ORDER — LABETALOL HCL 5 MG/ML IV SOLN
20.0000 mg | INTRAVENOUS | Status: DC | PRN
  Administered 2023-06-30: 20 mg via INTRAVENOUS
  Filled 2023-06-30 (×2): qty 4

## 2023-06-30 MED ORDER — HYDRALAZINE HCL 20 MG/ML IJ SOLN: 10.0000 mg | Freq: Four times a day (QID) | INTRAMUSCULAR | Status: DC | PRN

## 2023-06-30 MED ORDER — AMLODIPINE BESYLATE 5 MG PO TABS
5.0000 mg | ORAL_TABLET | Freq: Every day | ORAL | Status: DC
  Administered 2023-06-30 – 2023-07-01 (×2): 5 mg via ORAL
  Filled 2023-06-30 (×2): qty 1

## 2023-06-30 MED ORDER — POTASSIUM CHLORIDE CRYS ER 20 MEQ PO TBCR
20.0000 meq | EXTENDED_RELEASE_TABLET | Freq: Once | ORAL | Status: AC
  Administered 2023-06-30: 20 meq via ORAL
  Filled 2023-06-30: qty 1

## 2023-06-30 MED ORDER — LABETALOL HCL 5 MG/ML IV SOLN: 20.0000 mg | Freq: Once | INTRAVENOUS | Status: DC

## 2023-06-30 MED ORDER — LABETALOL HCL 5 MG/ML IV SOLN
20.0000 mg | Freq: Once | INTRAVENOUS | Status: AC
  Administered 2023-06-30: 20 mg via INTRAVENOUS

## 2023-06-30 MED ORDER — HYDRALAZINE HCL 20 MG/ML IJ SOLN
10.0000 mg | Freq: Four times a day (QID) | INTRAMUSCULAR | Status: DC | PRN
  Administered 2023-06-30: 10 mg via INTRAVENOUS
  Filled 2023-06-30: qty 1

## 2023-06-30 MED ORDER — CARVEDILOL 6.25 MG PO TABS
6.2500 mg | ORAL_TABLET | Freq: Two times a day (BID) | ORAL | Status: DC
  Administered 2023-06-30: 6.25 mg via ORAL
  Filled 2023-06-30: qty 1

## 2023-06-30 MED ORDER — LABETALOL HCL 5 MG/ML IV SOLN: 0.5000 mg/min | Status: DC

## 2023-06-30 MED ORDER — LABETALOL HCL 5 MG/ML IV SOLN
20.0000 mg | Freq: Once | INTRAVENOUS | Status: DC
  Filled 2023-06-30: qty 4

## 2023-06-30 NOTE — ED Notes (Signed)
 Called lab to add extra urine tests.

## 2023-06-30 NOTE — Progress Notes (Signed)
 Patient ID: Tanya Perkins, female    DOB: [birth year], 43 y.o.   MRN: [id number]   Assessment & Plan:  Encounter for annual physical exam  Hypertensive urgency  Pain in joint of right shoulder  Mass of right ovary  Abnormal mammogram    Assessment and Plan    Hypertension Extremely elevated blood pressure (214/140) despite rest. Possible contributing factors include recent illness, Sudafed use, and stress. Family history of hypertension. -Recommended immediate presentation to the ER for evaluation and control of blood pressure.  Sinusitis Recent sinus symptoms managed with intermittent Sudafed use - needs to discontinue this immediately and only use nasal saline +/- Flonase.   Ovarian Cysts / Mass R ovary  Reports of pain associated with ovarian cysts, particularly around menstrual cycle. Last ultrasound in 2023, with two more since then not documented in this system. -Strongly encouraged follow up with gynecology in two months as scheduled.  Breast Health Mammogram in July 2024 showed one breast appearing denser than the other, recommended diagnostic mammogram and ultrasound not yet completed. -Encouraged to schedule and complete recommended diagnostic mammogram and ultrasound.  Weight Management Successful weight loss of 20 pounds over the past 1.5 years, with a goal to lose an additional 10-20 pounds. -Encouraged to continue current weight loss strategies.  Scheduled Blood Work Blood work scheduled for 07/02/2023. -Complete scheduled blood work as planned.       Patient Counseling: [x]   Nutrition: Stressed importance of moderation in sodium/caffeine intake, saturated fat and cholesterol, caloric balance, sufficient intake of fresh fruits, vegetables, and fiber.  [x]   Stressed the importance of regular exercise.   [x]   Substance Abuse: Discussed cessation/primary prevention of tobacco, alcohol, or other drug use; driving or other dangerous activities under the  influence; availability of treatment for abuse.   []   Injury prevention: Discussed safety belts, safety helmets, smoke detector, smoking near bedding or upholstery.   []   Sexuality: Discussed sexually transmitted diseases, partner selection, use of condoms, avoidance of unintended pregnancy  and contraceptive alternatives.   [x]   Dental health: Discussed importance of regular tooth brushing, flossing, and dental visits.  [x]   Health maintenance and immunizations reviewed. Please refer to Health maintenance section.      Subjective:    Chief Complaint  Patient presents with   Annual Exam    Pt in office for annual CPE; GYN found fibroid on ovary, but no other changes; pt is having issues with sinuses and not able to wear contacts right now; Been seeing Ortho for R shoulder pain, using arthritis pain creme right now; pt due for labs but not fasting, will come back for lab only appt for labs    HPI   History of Present Illness   Tanya Perkins is a 43 year old female who presents with elevated blood pressure and recent illness.  She has been experiencing elevated blood pressure, which she attributes to recent illness and stress. Her blood pressure tends to rise when she is sick or stressed. She has not been on any blood pressure medication and is concerned about her current high readings. There is a family history of hypertension, with her brother on blood pressure medication. She has been using Sudafed for sinus issues, which she takes as needed, typically one every six to eight hours, and more consistently during her recent illness.  Last week, she experienced a migraine, fever, and vomiting, suggesting a possible viral infection, as a coworker also fell ill. She was able to return to  work by Friday. She reports sinus issues and swollen glands during this period. No chest pain, shortness of breath, dizziness, vision changes, or changes in bowel habits.  She experiences significant pain  related to ovarian cysts, which have caused her to nearly pass out in the past. Patient reports multiple ultrasounds have confirmed the presence of fibroids and cysts. She is scheduled to follow up with her gynecologist in two months. She has a history of fibroid removal from her uterus.  Chronic shoulder pain is managed with Voltaren cream at night. She avoids sleeping on her side to manage the pain. The pain is persistent but sometimes unnoticed.  She has a history of skin issues, including dry skin for which she uses prescribed creams. She regularly visits a dermatologist and has had clean checks for previous skin lesions.  She has lost 20 pounds over the last year and a half and aims to lose an additional 10 to 20 pounds gradually. She attributes her weight loss to regular walking with her dog, a [place] bulldog she rescued. She does not consume alcohol or smoke.       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    Past Surgical History:  Procedure Laterality Date   MYOMECTOMY ABDOMINAL APPROACH  05/2007   THYROID LOBECTOMY     LEFT - benign tumor   Tooth #20 implant      Family History  Problem Relation Age of Onset   Thyroid disease Mother        ?due to accident   Hypertension Father    Hyperlipidemia Father    Heart attack Father    Hypertension Brother    Hyperlipidemia Brother    Diabetes Maternal Aunt    Hypertension Maternal Aunt    Diabetes Maternal Uncle    Hypertension Maternal Uncle    Hypertension Paternal Aunt    Hypertension Paternal Uncle    Diabetes Maternal Grandmother    Stroke Maternal Grandmother    Leukemia Maternal Grandfather    Cancer Maternal Grandfather        lung   Hypertension Maternal Grandfather    Breast cancer Paternal Grandmother        Age 59's   Stroke Paternal Grandfather    Hypertension Paternal Grandfather     Diabetes Cousin    Asthma Cousin     Social History   Tobacco Use   Smoking status: Former   Smokeless tobacco: Never  Advertising account planner   Vaping status: Never Used  Substance Use Topics   Alcohol use: Not Currently    Comment: Rare   Drug use: No     No Known Allergies  Review of Systems NEGATIVE UNLESS OTHERWISE INDICATED IN HPI      Objective:     BP (!) 214/150 (BP Location: Right Arm, Cuff Size: Large) Comment (BP Location): laying down  Pulse 97   Temp 98.2 F (36.8 C) (Temporal)   Ht 5\' 3"  (1.6 m)   Wt 178 lb (80.7 kg)   LMP 06/17/2023 (Exact Date)   SpO2 96%   BMI 31.53 kg/m   Wt Readings from Last 3 Encounters:  06/30/23 178 lb (80.7 kg)  07/08/21 192 lb (87.1 kg)  07/01/21 193 lb 8 oz (87.8 kg)    BP Readings from Last 3 Encounters:  06/30/23 (!) 214/150  07/15/21 (!) 150/90  07/08/21 140/88     Physical Exam Vitals and nursing note reviewed.  Constitutional:      Appearance: Normal appearance. She is normal weight. She is not toxic-appearing.  HENT:     Head: Normocephalic and atraumatic.     Right Ear: Tympanic membrane, ear canal and external ear normal.     Left Ear: Tympanic membrane, ear canal and external ear normal.     Nose: Nose normal.     Mouth/Throat:     Mouth: Mucous membranes are moist.  Eyes:     Extraocular Movements: Extraocular movements intact.     Conjunctiva/sclera: Conjunctivae normal.     Pupils: Pupils are equal, round, and reactive to light.  Cardiovascular:     Rate and Rhythm: Normal rate and regular rhythm.     Pulses: Normal pulses.     Heart sounds: Normal heart sounds.  Pulmonary:     Effort: Pulmonary effort is normal.     Breath sounds: Normal breath sounds.  Abdominal:     General: Abdomen is flat. Bowel sounds are normal.     Palpations: Abdomen is soft.     Tenderness: There is no abdominal tenderness. There is no right CVA tenderness or left CVA tenderness.  Musculoskeletal:        General: Normal  range of motion.     Cervical back: Normal range of motion and neck supple.     Right lower leg: No edema.     Left lower leg: No edema.  Skin:    General: Skin is warm and dry.     Comments: Scars left upper back from prior skin ca removal   Neurological:     General: No focal deficit present.     Mental Status: She is alert and oriented to person, place, and time.  Psychiatric:        Mood and Affect: Mood normal.        Behavior: Behavior normal.        Thought Content: Thought content normal.        Judgment: Judgment normal.        Lennis Rader M Carle Fenech, PA-C

## 2023-06-30 NOTE — Plan of Care (Addendum)
    Tanya Perkins, is a 43 y.o. female, DOB - 1981/04/21, WGN:[id number]   With no major medical problems who developed sinus allergies and was taking a lot of Sudafed, went to see her PCP where she was found to have very high blood pressures with AKI and [facility] blood pressures were confirmed to be high AKI was confirmed and I was requested to admit the patient.  Placing her on moderate dose Coreg, low-dose Norvasc along with as needed hydralazine, checking urine electrolytes and bladder scan.  Admitting to telemetry bed.  Likely AKI due to uncontrolled hypertension, blood pressure is likely worse with Sudafed but probably has some chronic CKD and uncontrolled hypertension as well.  PCP note from 2 years ago shows a blood pressure of 152/85   Vitals:   06/30/23 1522 06/30/23 1600 06/30/23 1615 06/30/23 1630  BP: (!) 236/144 (!) 217/124 (!) 221/124 (!) 215/122  Pulse: (!) 113 (!) 102 97 99  Resp: 16 (!) 25 13 (!) 25  Temp: 98.2 F (36.8 C)     SpO2: 100% 99% 98% 97%        Data Review   Micro Results No results found for this or any previous visit (from the past 240 hours).  Radiology Reports No results found.  CBC Recent Labs  Lab 06/30/23 1525  WBC 13.6*  HGB 12.5  HCT 37.5  PLT 484*  MCV 80.5  MCH 26.8  MCHC 33.3  RDW 13.5  LYMPHSABS 2.4  MONOABS 0.5  EOSABS 0.2  BASOSABS 0.1    Chemistries  Recent Labs  Lab 06/30/23 1525  NA 139  K 3.2*  CL 101  CO2 28  GLUCOSE 96  BUN 14  CREATININE 1.88*  CALCIUM 9.0   ------------------------------------------------------------------------------------------------------------------ estimated creatinine clearance is 38.8 mL/min (A) (by C-G formula based on SCr of 1.88 mg/dL (H)). ------------------------------------------------------------------------------------------------------------------ No results for  input(s): "HGBA1C" in the last 72 hours. ------------------------------------------------------------------------------------------------------------------ No results for input(s): "CHOL", "HDL", "LDLCALC", "TRIG", "CHOLHDL", "LDLDIRECT" in the last 72 hours. ------------------------------------------------------------------------------------------------------------------ Recent Labs    06/30/23 1618  TSH 2.679   ------------------------------------------------------------------------------------------------------------------ No results for input(s): "VITAMINB12", "FOLATE", "FERRITIN", "TIBC", "IRON", "RETICCTPCT" in the last 72 hours.  Coagulation profile No results for input(s): "INR", "PROTIME" in the last 168 hours.  No results for input(s): "DDIMER" in the last 72 hours.  Cardiac Enzymes No results for input(s): "CKMB", "TROPONINI", "MYOGLOBIN" in the last 168 hours.  Invalid input(s): "CK" ------------------------------------------------------------------------------------------------------------------ Invalid input(s): "POCBNP"   Signature  Susa Raring M.D on 06/30/2023 at 5:35 PM   -  To page go to [email or web address]

## 2023-06-30 NOTE — ED Provider Notes (Signed)
 [place] EMERGENCY DEPARTMENT AT [facility] Provider Note   CSN: [id number] Arrival date & time: 06/30/23  1442     History  Chief Complaint  Patient presents with   Hypertension    Tanya Perkins is a 43 y.o. female.   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  The patient states that she has chronic allergic sinusitis.  She has been taking Sudafed for this daily.  She denies any fevers, denies any purulent drainage from her sinuses.  Her most recent episode of sinusitis has only been present for the past few days but has been prompting her Sudafed use.  She is not on any antihypertensives and is not sure of her baseline blood pressure.  She denies any headaches, visual field deficits, double vision, facial droop, numbness or weakness.  She denies any difficulty with gait.  She denies any chest pain, shortness of breath, abdominal pain.  Home Medications Prior to Admission medications   Medication Sig Start Date End Date Taking? Authorizing Provider  ibuprofen (ADVIL) 600 MG tablet Take 1 tablet (600 mg total) by mouth every 8 (eight) hours as needed. 07/03/21   Jarold Motto, PA  Multiple Vitamins-Minerals (MULTIVITAMIN ADULTS PO)     [provider]  Omega-3 Fatty Acids (FISH OIL) 1000 MG CAPS Take 1 capsule by mouth every other day.    [provider]      Allergies    Patient has no known allergies.    Review of Systems   Review of Systems  All other systems reviewed and are negative.   Physical Exam Updated Vital Signs BP (!) 215/122   Pulse 99   Temp 98.2 F (36.8 C)   Resp (!) 25   LMP 06/17/2023 (Exact Date)   SpO2 97%  Physical Exam Vitals  and nursing note reviewed.  Constitutional:      General: She is not in acute distress.    Appearance: She is well-developed.  HENT:     Head: Normocephalic and atraumatic.  Eyes:     General: Vision grossly intact. No visual field deficit.    Conjunctiva/sclera: Conjunctivae normal.     Comments: No visual field deficit  Cardiovascular:     Rate and Rhythm: Normal rate and regular rhythm.     Heart sounds: No murmur heard. Pulmonary:     Effort: Pulmonary effort is normal. No respiratory distress.     Breath sounds: Normal breath sounds.  Abdominal:     Palpations: Abdomen is soft.     Tenderness: There is no abdominal tenderness.  Musculoskeletal:        General: No swelling.     Cervical back: Neck supple.  Skin:    General: Skin is warm and dry.     Capillary Refill: Capillary refill takes less than 2 seconds.  Neurological:     Mental Status: She is alert.     Comments: MENTAL STATUS EXAM:    Orientation: Alert and oriented to person, place and time.  Memory: Cooperative, follows commands well.  Language: Speech is clear and language is normal.   CRANIAL NERVES:    CN 2 (Optic): Visual fields intact to confrontation.  CN 3,4,6 (EOM): Pupils equal and reactive to light. Full  extraocular eye movement without nystagmus.  CN 5 (Trigeminal): Facial sensation is normal, no weakness of masticatory muscles.  CN 7 (Facial): No facial weakness or asymmetry.  CN 8 (Auditory): Auditory acuity grossly normal.  CN 9,10 (Glossophar): The uvula is midline, the palate elevates symmetrically.  CN 11 (spinal access): Normal sternocleidomastoid and trapezius strength.  CN 12 (Hypoglossal): The tongue is midline. No atrophy or fasciculations.Marland Kitchen   MOTOR:  Muscle Strength: 5/5RUE, 5/5LUE, 5/5RLE, 5/5LLE.   COORDINATION:   No tremor.   SENSATION:   Intact to light touch all four extremities.     Psychiatric:        Mood and Affect: Mood normal.     ED Results / Procedures /  Treatments   Labs (all labs ordered are listed, but only abnormal results are displayed) Labs Reviewed  CBC WITH DIFFERENTIAL/PLATELET - Abnormal; Notable for the following components:      Result Value   WBC 13.6 (*)    Platelets 484 (*)    Neutro Abs 10.4 (*)    All other components within normal limits  BASIC METABOLIC PANEL - Abnormal; Notable for the following components:   Potassium 3.2 (*)    Creatinine, Ser 1.88 (*)    GFR, Estimated 34 (*)    All other components within normal limits  URINALYSIS, ROUTINE W REFLEX MICROSCOPIC - Abnormal; Notable for the following components:   APPearance HAZY (*)    Hgb urine dipstick LARGE (*)    Protein, ur >300 (*)    Bacteria, UA RARE (*)    All other components within normal limits  TSH  T4, FREE  HCG, SERUM, QUALITATIVE  TROPONIN I (HIGH SENSITIVITY)    EKG EKG Interpretation Date/Time:  Tuesday June 30 2023 16:10:30 EST Ventricular Rate:  102 PR Interval:  145 QRS Duration:  83 QT Interval:  357 QTC Calculation: 465 R Axis:   75  Text Interpretation: Sinus tachycardia Consider right atrial enlargement Confirmed by Ernie Avena ([id number]) on 06/30/2023 4:43:58 PM  Radiology No results found.  Procedures .Critical Care  Performed by: Ernie Avena, MD Authorized by: Ernie Avena, MD   Critical care provider statement:    Critical care time (minutes):  30   Critical care was time spent personally by me on the following activities:  Development of treatment plan with patient or surrogate, discussions with consultants, evaluation of patient's response to treatment, examination of patient, ordering and review of laboratory studies, ordering and review of radiographic studies, ordering and performing treatments and interventions, pulse oximetry, re-evaluation of patient's condition and review of old charts   Care discussed with: admitting provider       Medications Ordered in ED Medications  labetalol (NORMODYNE)  infusion 5 mg/mL (has no administration in time range)  potassium chloride SA (KLOR-CON M) CR tablet 20 mEq (has no administration in time range)  labetalol (NORMODYNE) injection 20 mg (20 mg Intravenous Given 06/30/23 1646)    ED Course/ Medical Decision Making/ A&P Clinical Course as of 06/30/23 1659  Tue Jun 30, 2023  1639 BP(!): 236/144 [JL]  1643 Creatinine(!): 1.88 [JL]    Clinical Course User Index [JL] Ernie Avena, MD                                 Medical Decision Making Amount and/or Complexity of Data Reviewed Labs: ordered. Decision-making details documented in ED Course.  Risk Prescription drug management. Decision regarding  hospitalization.     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  The patient states that she has chronic allergic sinusitis.  She has been taking Sudafed for this daily.  She denies any fevers, denies any purulent drainage from her sinuses.  Her most recent episode of sinusitis has only been present for the past few days but has been prompting her Sudafed use.  She is not on any antihypertensives and is not sure of her baseline blood pressure.  She denies any headaches, visual field deficits, double vision, facial droop, numbness or weakness.  She denies any difficulty with gait.  She denies any chest pain, shortness of breath, abdominal pain.  On arrival, the she was afebrile temperature 98.2, tachycardic heart rate 113, not tachypneic RR 16, BP 236/144, saturating 100% on room air.  On exam the patient had clear lungs to auscultation bilaterally, her abdomen was soft, nontender, nondistended, no rebound or guarding, her neurologic exam was nonfocal with no cranial nerve deficits and  intact second-story station throughout all 4 extremities.  Patient has no chest pain, shortness of breath or symptoms immediately concerning for hypertensive emergency.  Given the severity of her initial hypertensive presentation with a BP of 236/144, will initiate EKG and screening labs.  Considered trigger of the patient's hypertension to be her daily Sudafed use for her sinusitis.  At this time, low concern for ACS, PE, aortic dissection.  Initial EKG revealed sinus tachycardia, ventricular rate 102, no acute ischemic changes.  Gwyndolyn Kaufman evaluation revealed a cardiac troponin of 6, CBC with a nonspecific leukocytosis to 13.6, BMP with mild hypokalemia to 3.2, evidence of an acute kidney injury with a serum creatinine of 1.88, baseline of 0.8-1.  The patient on urinalysis was also found to have hematuria.  Her last menstrual period was 2 weeks ago.  She has no evidence of UTI.  She had both proteinuria and hematuria on urinalysis.  Concern for hypertensive emergency with an AKI and severely elevated blood pressures.  The patient was administered a push of 20 mg of IV labetalol and was started on a labetalol infusion.  She remained asymptomatic.  Patient informed of the diagnosis and plan for likely admission.  Hospitalist medicine consulted for admission for further evaluation and management.   Final Clinical Impression(s) / ED Diagnoses Final diagnoses:  Hypertensive emergency  AKI (acute kidney injury) [facility])    Rx / DC Orders ED Discharge Orders     None         Ernie Avena, MD 06/30/23 1734

## 2023-06-30 NOTE — Plan of Care (Signed)

## 2023-06-30 NOTE — ED Triage Notes (Signed)
 High BP at annual physical BP HIGh sent for eval by PCP.  Denies chest pain sob

## 2023-07-01 ENCOUNTER — Inpatient Hospital Stay (HOSPITAL_COMMUNITY): Payer: Federal, State, Local not specified - PPO

## 2023-07-01 DIAGNOSIS — I160 Hypertensive urgency: Principal | ICD-10-CM

## 2023-07-01 DIAGNOSIS — N179 Acute kidney failure, unspecified: Secondary | ICD-10-CM | POA: Diagnosis not present

## 2023-07-01 DIAGNOSIS — N1330 Unspecified hydronephrosis: Secondary | ICD-10-CM | POA: Diagnosis not present

## 2023-07-01 DIAGNOSIS — E876 Hypokalemia: Secondary | ICD-10-CM | POA: Diagnosis not present

## 2023-07-01 LAB — URINALYSIS, COMPLETE (UACMP) WITH MICROSCOPIC
Bilirubin Urine: NEGATIVE
Glucose, UA: NEGATIVE mg/dL
Ketones, ur: NEGATIVE mg/dL
Leukocytes,Ua: NEGATIVE
Nitrite: NEGATIVE
Protein, ur: 100 mg/dL — AB
Specific Gravity, Urine: 1.005 (ref 1.005–1.030)
pH: 6 (ref 5.0–8.0)

## 2023-07-01 LAB — BASIC METABOLIC PANEL
Anion gap: 13 (ref 5–15)
Anion gap: 7 (ref 5–15)
BUN: 13 mg/dL (ref 6–20)
BUN: 13 mg/dL (ref 6–20)
CO2: 22 mmol/L (ref 22–32)
CO2: 25 mmol/L (ref 22–32)
Calcium: 8.7 mg/dL — ABNORMAL LOW (ref 8.9–10.3)
Calcium: 8.6 mg/dL — ABNORMAL LOW (ref 8.9–10.3)
Chloride: 104 mmol/L (ref 98–111)
Chloride: 106 mmol/L (ref 98–111)
Creatinine, Ser: 1.82 mg/dL — ABNORMAL HIGH (ref 0.44–1.00)
Creatinine, Ser: 1.9 mg/dL — ABNORMAL HIGH (ref 0.44–1.00)
GFR, Estimated: 35 mL/min — ABNORMAL LOW (ref >60)
GFR, Estimated: 33 mL/min — ABNORMAL LOW (ref >60)
Glucose, Bld: 96 mg/dL (ref 70–99)
Glucose, Bld: 96 mg/dL (ref 70–99)
Potassium: 3.4 mmol/L — ABNORMAL LOW (ref 3.5–5.1)
Potassium: 3.8 mmol/L (ref 3.5–5.1)
Sodium: 139 mmol/L (ref 135–145)
Sodium: 138 mmol/L (ref 135–145)

## 2023-07-01 LAB — PROTEIN / CREATININE RATIO, URINE
Creatinine, Urine: 42 mg/dL
Protein Creatinine Ratio: 3.67 mg/mg{creat} — ABNORMAL HIGH (ref 0.00–0.15)
Total Protein, Urine: 154 mg/dL

## 2023-07-01 LAB — TSH: TSH: 2.742 u[IU]/mL (ref 0.350–4.500)

## 2023-07-01 LAB — HIV ANTIBODY (ROUTINE TESTING W REFLEX): HIV Screen 4th Generation wRfx: NONREACTIVE

## 2023-07-01 LAB — SODIUM, URINE, RANDOM: Sodium, Ur: 40 mmol/L

## 2023-07-01 LAB — MRSA NEXT GEN BY PCR, NASAL: MRSA by PCR Next Gen: NOT DETECTED

## 2023-07-01 LAB — CREATININE, URINE, RANDOM: Creatinine, Urine: 155 mg/dL

## 2023-07-01 LAB — OSMOLALITY, URINE: Osmolality, Ur: 364 mosm/kg (ref 300–900)

## 2023-07-01 LAB — MAGNESIUM: Magnesium: 1.9 mg/dL (ref 1.7–2.4)

## 2023-07-01 MED ORDER — POTASSIUM CHLORIDE 20 MEQ PO PACK
40.0000 meq | PACK | Freq: Once | ORAL | Status: AC
  Administered 2023-07-01: 40 meq via ORAL
  Filled 2023-07-01: qty 2

## 2023-07-01 MED ORDER — ENOXAPARIN SODIUM 40 MG/0.4ML IJ SOSY
40.0000 mg | PREFILLED_SYRINGE | INTRAMUSCULAR | Status: DC
  Filled 2023-07-01 (×2): qty 0.4

## 2023-07-01 MED ORDER — ONDANSETRON HCL 4 MG PO TABS
4.0000 mg | ORAL_TABLET | Freq: Four times a day (QID) | ORAL | Status: DC | PRN
Start: 2023-07-01 — End: 2023-07-02

## 2023-07-01 MED ORDER — HYDRALAZINE HCL 20 MG/ML IJ SOLN
10.0000 mg | Freq: Four times a day (QID) | INTRAMUSCULAR | Status: DC | PRN
  Administered 2023-07-02: 10 mg via INTRAVENOUS
  Filled 2023-07-01: qty 1

## 2023-07-01 MED ORDER — CARVEDILOL 6.25 MG PO TABS: 6.2500 mg | ORAL_TABLET | Freq: Four times a day (QID) | ORAL | Status: DC | PRN

## 2023-07-01 MED ORDER — BISACODYL 5 MG PO TBEC: 5.0000 mg | DELAYED_RELEASE_TABLET | Freq: Every day | ORAL | Status: DC | PRN

## 2023-07-01 MED ORDER — ONDANSETRON HCL 4 MG/2ML IJ SOLN: 4.0000 mg | Freq: Four times a day (QID) | INTRAMUSCULAR | Status: DC | PRN

## 2023-07-01 MED ORDER — MELATONIN 3 MG PO TABS
3.0000 mg | ORAL_TABLET | Freq: Every evening | ORAL | Status: DC | PRN
  Filled 2023-07-01: qty 1

## 2023-07-01 MED ORDER — ACETAMINOPHEN 650 MG RE SUPP: 650.0000 mg | Freq: Four times a day (QID) | RECTAL | Status: DC | PRN

## 2023-07-01 MED ORDER — ACETAMINOPHEN 325 MG PO TABS: 650.0000 mg | ORAL_TABLET | Freq: Four times a day (QID) | ORAL | Status: DC | PRN

## 2023-07-01 NOTE — Progress Notes (Addendum)
 Progress Note   Patient: Tanya Perkins QMV:[id number] DOB: Dec 31, 1980 DOA: 06/30/2023     1 DOS: the patient was seen and examined on 07/01/2023   Brief hospital course: 43 year old woman with history of chronic sinusitis who presented to the ED from her doctor's office after she was found to have very elevated blood pressure of 236/144.  Patient admitted with hypertensive urgency.  Assessment and Plan:  Hypertensive emergency Presented with BP 236/44. Evidence of endorgan damage with AKI. Patient had taken Sudafed prior to presentation. Blood pressure remains elevated.  -Sudafed discontinued. -Patient received IV hydralazine, labetalol, and has been started on Norvasc.. - Will continue Norvasc for now but will likely need a second agent to control blood pressure.   AKI versus CKD AKI possibly due to hypertensive urgency. Patient presented with creatinine of 1.88.  Trended down overnight but then trended up again. Urine electrolytes are pending. Of note, last normal GFR was in 2019.  Labs drawn in March 2022 showed slightly reduced GFR and creatinine 1.06.  2023 labs showed GFR 59 and creatinine 1.15. -Follow-up urine electrolytes. -Follow-up urine protein creatinine ratio. -Renal ultrasound. -Nephrology consult. -Prerenal renal failure felt unlikely and therefore I will hold off giving patient IV fluids, especially in the setting of elevated blood pressure.  Subjective: Patient has no complaints.  She feels well.  Physical Exam: Vitals:   07/01/23 0800 07/01/23 0900 07/01/23 1000 07/01/23 1100  BP: (!) 145/88 (!) 146/84 (!) 159/95 (!) 149/83  Pulse: 88 82 87 83  Resp: 16 16 15 19   Temp:    97.8 F (36.6 C)  TempSrc:    Oral  SpO2: 91% 94% 94% 94%  Weight:      Height:        General: Alert, oriented X3  Eyes: Pupils equal, reactive  Oral cavity: moist mucous membranes  Head: Atraumatic, normocephalic  Neck: supple  Chest: clear to auscultation. No crackles, no  wheezes  CVS: S1,S2 RRR. No murmurs  Abd: No distention, soft, non-tender. No masses palpable  Extr: No edema   MSK: No joint deformities or swelling  Neurological: Grossly intact.    Data Reviewed:     Latest Ref Rng & Units 06/30/2023    3:25 PM 07/01/2021   11:51 AM 07/09/2020   11:25 AM  CBC  WBC 4.0 - 10.5 K/uL 13.6  12.2  11.1   Hemoglobin 12.0 - 15.0 g/dL 28.4  13.2  44.0   Hematocrit 36.0 - 46.0 % 37.5  37.8  34.4   Platelets 150 - 400 K/uL 484  239.0  450.0       Latest Ref Rng & Units 07/01/2023    2:06 PM 07/01/2023    2:22 AM 06/30/2023    3:25 PM  BMP  Glucose 70 - 99 mg/dL 96  96  96   BUN 6 - 20 mg/dL 13  13  14    Creatinine 0.44 - 1.00 mg/dL 1.02  7.25  3.66   Sodium 135 - 145 mmol/L 138  139  139   Potassium 3.5 - 5.1 mmol/L 3.8  3.4  3.2   Chloride 98 - 111 mmol/L 106  104  101   CO2 22 - 32 mmol/L 25  22  28    Calcium 8.9 - 10.3 mg/dL 8.6  8.7  9.0      Family Communication: Spoke with mother present at bedside.  Disposition: Status is: Inpatient Remains inpatient appropriate because: Acute kidney injury with worsening renal indices, uncontrolled hypertension.  Planned Discharge Destination: Home    Time spent: 50 minutes  Author: Marcine Matar, MD 07/01/2023 1:39 PM  For on call review [email or web address].

## 2023-07-01 NOTE — Consult Note (Signed)
 Nephrology Consult   Reason for consult: AKI  Assessment/Recommendations:   AKI -last known Cr from 2023 was 1.15. AKI likely secondary to hemodynamic insults from HTN urgency which widely fluctuated thereafter. Her Cr is stable today fortunately -does have microscopic hematuria but when in HTN urgency, will repeat. In the interim, will check GN serologies -renal u/s, UPC pending -encouraged oral hydration in the interim -Avoid nephrotoxic medications including NSAIDs and iodinated intravenous contrast exposure unless the latter is absolutely indicated.  Preferred narcotic agents for pain control are hydromorphone, fentanyl, and methadone. Morphine should not be used. Avoid Baclofen and avoid oral sodium phosphate and magnesium citrate based laxatives / bowel preps. Continue strict Input and Output monitoring. Will monitor the patient closely with you and intervene or adjust therapy as indicated by changes in clinical status/labs   HTN urgency -her BP is currently acceptable as compared to when she first came in, agree with norvasc  Hypokalemia -resolved  Recommendations conveyed to primary service.    Anthony Sar [facility] 07/01/2023 4:16 PM   _____________________________________________________________________________________   History of Present Illness: Tanya Perkins is a/an 43 y.o. female with no known past medical history who presents to [facility] with HTN.  She went to her doctor's office for routine scheduled checkup and her blood pressure was apparently extremely high (214/140).  Did use Sudafed recently given her frequent sinus issues.  She received IV hydralazine, labetalol.  Started on Norvasc.  She did have a drop in her blood pressure down to 115/73 last night.  He was found to have a creatinine of 1.8 on presentation, 1.9 this afternoon hence consultation requested.  Patient seen and examined bedside. Patient reports that she has frequent sinus infections.  Had only one dose of Sudafed prior to PCP's appt. She reports that she had flu about a week and a half ago, and then root canal (preop amoxicillin) prior to that. She denies any known history of HTN. Otherwise denies any chest pain, SOB, swelling, rash, epistaxis, oral/nasal ulcers.   Medications:  Current Facility-Administered Medications  Medication Dose Route Frequency Provider Last Rate Last Admin   acetaminophen (TYLENOL) tablet 650 mg  650 mg Oral Q6H PRN Buena Irish, MD       Or   acetaminophen (TYLENOL) suppository 650 mg  650 mg Rectal Q6H PRN Buena Irish, MD       amLODipine (NORVASC) tablet 5 mg  5 mg Oral Daily Leroy Sea, MD   5 mg at 07/01/23 0944   bisacodyl (DULCOLAX) EC tablet 5 mg  5 mg Oral Daily PRN Buena Irish, MD       enoxaparin (LOVENOX) injection 40 mg  40 mg Subcutaneous Q24H Buena Irish, MD       hydrALAZINE (APRESOLINE) injection 10 mg  10 mg Intravenous Q6H PRN Buena Irish, MD       melatonin tablet 3 mg  3 mg Oral QHS PRN Buena Irish, MD       ondansetron [facility]) tablet 4 mg  4 mg Oral Q6H PRN Buena Irish, MD       Or   ondansetron [facility]) injection 4 mg  4 mg Intravenous Q6H PRN Buena Irish, MD         ALLERGIES Patient has no known allergies.  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  SOCIAL HISTORY Social History   Socioeconomic History   Marital status: Single    Spouse name: Not on file   Number of children: Not on file   Years of education: Not on file   Highest education level: Not on file  Occupational History   Not on file  Tobacco Use   Smoking status: Former   Smokeless tobacco: Never  Vaping Use   Vaping status: Never Used  Substance and Sexual Activity   Alcohol use: Not Currently    Comment: Rare   Drug use: No   Sexual  activity: Not Currently    Partners: Male    Birth control/protection: None  Other Topics Concern   Not on file  Social History Narrative   1 son 2003- [name], mother is Derrill Kay   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, multimedia   Single   Enjoys sleeping, watching her son's baseball   Social Drivers of Corporate investment banker Strain: Not on file  Food Insecurity: No Food Insecurity (06/30/2023)   Hunger Vital Sign    Worried About Running Out of Food in the Last Year: Never true    Ran Out of Food in the Last Year: Never true  Transportation Needs: No Transportation Needs (06/30/2023)   PRAPARE - Administrator, Civil Service (Medical): No    Lack of Transportation (Non-Medical): No  Physical Activity: Not on file  Stress: Not on file  Social Connections: Moderately Integrated (06/30/2023)   Social Connection and Isolation Panel [NHANES]    Frequency of Communication with Friends and Family: More than three times a week    Frequency of Social Gatherings with Friends and Family: More than three times a week    Attends Religious Services: More than 4 times per year    Active Member of [employer] or Organizations: Yes    Attends Banker Meetings: 1 to 4 times per year    Marital Status: Never married  Intimate Partner Violence: Not At Risk (06/30/2023)   Humiliation, Afraid, Rape, and Kick questionnaire    Fear of Current or Ex-Partner: No    Emotionally Abused: No    Physically Abused: No    Sexually Abused: No     FAMILY HISTORY Family History  Problem Relation Age of Onset   Thyroid disease Mother        ?due to accident   Hypertension Father    Hyperlipidemia Father    Heart attack Father    Hypertension Brother    Hyperlipidemia Brother    Diabetes Maternal Aunt    Hypertension Maternal Aunt    Diabetes Maternal Uncle    Hypertension Maternal Uncle    Hypertension Paternal Aunt    Hypertension Paternal Uncle    Diabetes  Maternal Grandmother    Stroke Maternal Grandmother    Leukemia Maternal Grandfather    Cancer Maternal Grandfather        lung   Hypertension Maternal Grandfather    Breast cancer Paternal Grandmother        Age 70's   Stroke Paternal Grandfather    Hypertension Paternal Grandfather    Diabetes Cousin    Asthma Cousin      No family history of kidney disease  Review of Systems: 12 systems reviewed Otherwise as per HPI, all other systems reviewed and negative  Physical Exam: Vitals:   07/01/23 1000 07/01/23 1100  BP: (!) 159/95 (!) 149/83  Pulse: 87 83  Resp:  15 19  Temp:  97.8 F (36.6 C)  SpO2: 94% 94%   Total I/O In: 240 [P.O.:240] Out: -   Intake/Output Summary (Last 24 hours) at 07/01/2023 1616 Last data filed at 07/01/2023 1100 Gross per 24 hour  Intake 240 ml  Output --  Net 240 ml   General: well-appearing, no acute distress HEENT: anicteric sclera, oropharynx clear without lesions CV: regular rate, normal rhythm, no murmurs, no gallops, no rubs, no peripheral edema Lungs: clear to auscultation bilaterally, normal work of breathing Abd: soft, non-tender, non-distended Skin: no visible lesions or rashes Psych: alert, engaged, appropriate mood and affect Musculoskeletal: no obvious deformities Neuro: normal speech, no gross focal deficits   Test Results Reviewed Lab Results  Component Value Date   NA 138 07/01/2023   K 3.8 07/01/2023   CL 106 07/01/2023   CO2 25 07/01/2023   BUN 13 07/01/2023   CREATININE 1.90 (H) 07/01/2023   GFR 59.03 (L) 07/01/2021   CALCIUM 8.6 (L) 07/01/2023   ALBUMIN 3.9 07/01/2021     I have reviewed all relevant outside healthcare records related to the patient's kidney injury.

## 2023-07-01 NOTE — H&P (Signed)
 History and Physical    Patient: Tanya Perkins ZOX:[id number] DOB: May 17, 1980 DOA: 06/30/2023 DOS: the patient was seen and examined on 07/01/2023 PCP: Allwardt, Crist Infante, PA-C  Patient coming from:  doctor's office  Chief Complaint:  Chief Complaint  Patient presents with   Hypertension   HPI: Tanya Perkins is a healthy 43 y.o. female with no chronic medical problems other than frequent sinus trouble who presented to her doctor's office for routine scheduled checkup and her blood pressure was extremely high.  Arrival in the emergency department it was 236/144.  She has no history of high blood pressure.  She believes her last physical was about 2 years ago.  She was feeling fine, no headache or shortness of breath or chest pain.  Her only complaint was sinus postnasal drip.  She did take 1 Sudafed tablet in the morning prior to her doctor's visit.   The patient was sent from the doctor's office by ambulance to the emergency department.  We will be admitting the patient for blood pressure control.   Review of Systems: As mentioned in the history of present illness. All other systems reviewed and are negative. 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 She has never used smokeless tobacco. She reports that she does not currently use alcohol. She reports that she does not use drugs.  No Known Allergies  Family History  Problem Relation Age of Onset   Thyroid disease Mother        ?due to accident   Hypertension Father    Hyperlipidemia Father    Heart attack Father    Hypertension Brother    Hyperlipidemia Brother    Diabetes Maternal Aunt     Hypertension Maternal Aunt    Diabetes Maternal Uncle    Hypertension Maternal Uncle    Hypertension Paternal Aunt    Hypertension Paternal Uncle    Diabetes Maternal Grandmother    Stroke Maternal Grandmother    Leukemia Maternal Grandfather    Cancer Maternal Grandfather        lung   Hypertension Maternal Grandfather    Breast cancer Paternal Grandmother        Age 63's   Stroke Paternal Grandfather    Hypertension Paternal Grandfather    Diabetes Cousin    Asthma Cousin     Prior to Admission medications   Medication Sig Start Date End Date Taking? Authorizing Provider  ibuprofen (ADVIL) 600 MG tablet Take 1 tablet (600 mg total) by mouth every 8 (eight) hours as needed. 07/03/21   Jarold Motto, PA  Multiple Vitamins-Minerals (MULTIVITAMIN ADULTS PO)     [provider]  Omega-3 Fatty Acids (FISH OIL) 1000 MG CAPS Take 1 capsule by mouth every other day.    [provider]    Physical Exam: Vitals:   06/30/23 2015 06/30/23 2030 06/30/23 2125 06/30/23 2213  BP: 120/68 132/74 115/73 (!) 145/79  Pulse: 83  85 96  Resp: 19 19 17 14   Temp:  98.6 F (37 C)  98.4 F (36.9 C)  TempSrc:  Oral  Oral  SpO2: 100% 98% 93% 95%  Weight:    80.9 kg  Height:    5\' 3"  (1.6 m)   Physical Exam:  General: No acute distress, well developed, well nourished HEENT: Normocephalic, atraumatic, PERRL Cardiovascular: Normal rate and rhythm. Systolic murmur, Distal pulses intact. Pulmonary: Normal pulmonary effort, normal breath sounds Gastrointestinal: Nondistended abdomen, soft, non-tender, normoactive bowel sounds Musculoskeletal:No lower ext edema Skin: Skin is warm and dry. Neuro: No focal deficits noted, AAOx3. PSYCH: Attentive and cooperative  Data Reviewed:  Results for orders placed or performed during the hospital encounter of 06/30/23 (from the past 24 hours)  CBC with Differential     Status: Abnormal   Collection Time: 06/30/23  3:25 PM  Result Value  Ref Range   WBC 13.6 (H) 4.0 - 10.5 K/uL   RBC 4.66 3.87 - 5.11 MIL/uL   Hemoglobin 12.5 12.0 - 15.0 g/dL   HCT 72.5 36.6 - 44.0 %   MCV 80.5 80.0 - 100.0 fL   MCH 26.8 26.0 - 34.0 pg   MCHC 33.3 30.0 - 36.0 g/dL   RDW 34.7 42.5 - 95.6 %   Platelets 484 (H) 150 - 400 K/uL   nRBC 0.0 0.0 - 0.2 %   Neutrophils Relative % 75 %   Neutro Abs 10.4 (H) 1.7 - 7.7 K/uL   Lymphocytes Relative 18 %   Lymphs Abs 2.4 0.7 - 4.0 K/uL   Monocytes Relative 4 %   Monocytes Absolute 0.5 0.1 - 1.0 K/uL   Eosinophils Relative 2 %   Eosinophils Absolute 0.2 0.0 - 0.5 K/uL   Basophils Relative 1 %   Basophils Absolute 0.1 0.0 - 0.1 K/uL   Immature Granulocytes 0 %   Abs Immature Granulocytes 0.04 0.00 - 0.07 K/uL  Basic metabolic panel     Status: Abnormal   Collection Time: 06/30/23  3:25 PM  Result Value Ref Range   Sodium 139 135 - 145 mmol/L   Potassium 3.2 (L) 3.5 - 5.1 mmol/L   Chloride 101 98 - 111 mmol/L   CO2 28 22 - 32 mmol/L   Glucose, Bld 96 70 - 99 mg/dL   BUN 14 6 - 20 mg/dL   Creatinine, Ser 3.87 (H) 0.44 - 1.00 mg/dL   Calcium 9.0 8.9 - 56.4 mg/dL   GFR, Estimated 34 (L) >60 mL/min   Anion gap 10 5 - 15  Troponin I (High Sensitivity)     Status: None   Collection Time: 06/30/23  3:25 PM  Result Value Ref Range   Troponin I (High Sensitivity) 8 <18 ng/L  Urinalysis, Routine w reflex microscopic -Urine, Clean Catch     Status: Abnormal   Collection Time: 06/30/23  3:26 PM  Result Value Ref Range   Color, Urine YELLOW YELLOW   APPearance HAZY (A) CLEAR   Specific Gravity, Urine 1.017 1.005 - 1.030   pH 6.0 5.0 - 8.0   Glucose, UA NEGATIVE NEGATIVE mg/dL   Hgb urine dipstick LARGE (A) NEGATIVE   Bilirubin Urine NEGATIVE NEGATIVE   Ketones, ur NEGATIVE NEGATIVE mg/dL   Protein, ur >332 (A) NEGATIVE mg/dL   Nitrite NEGATIVE NEGATIVE   Leukocytes,Ua NEGATIVE NEGATIVE   RBC / HPF 21-50 0 - 5 RBC/hpf   WBC, UA 6-10 0 - 5 WBC/hpf   Bacteria, UA RARE (A) NONE SEEN   Squamous  Epithelial / HPF 0-5 0 - 5 /HPF   Mucus PRESENT   TSH     Status: None  Collection Time: 06/30/23  4:18 PM  Result Value Ref Range   TSH 2.679 0.350 - 4.500 uIU/mL  T4, free     Status: None   Collection Time: 06/30/23  4:18 PM  Result Value Ref Range   Free T4 0.85 0.61 - 1.12 ng/dL  hCG, serum, qualitative     Status: None   Collection Time: 06/30/23  4:18 PM  Result Value Ref Range   Preg, Serum NEGATIVE NEGATIVE  Osmolality     Status: None   Collection Time: 06/30/23  6:18 PM  Result Value Ref Range   Osmolality 289 275 - 295 mOsm/kg  MRSA Next Gen by PCR, Nasal     Status: None   Collection Time: 06/30/23 10:26 PM   Specimen: Nasal Mucosa; Nasal Swab  Result Value Ref Range   MRSA by PCR Next Gen NOT DETECTED NOT DETECTED     Assessment and Plan: Hypertensive urgency - it is hard to believe 1 Sudafed tablet made her blood pressure that high.  By the time of my evaluation the patient's blood pressure was 125/75.   She had been given 1 Norvasc and 1 Coreg 6.25 mg along with other as needed IV medications. - Plan to discharge on one medication that can be adjusted by primary doctor -I counseled the patient about over-the-counter decongestants and cold and flu and allergy medications and how they can increase blood pressure.  Patient has lots of trouble with sinus congestion and postnasal drip so she uses over-the-counter medications frequently.  I have recommended saline nasal rinses for her symptoms instead.    Advance Care Planning:   Code Status: Not on file the patient names her mother as her surrogate decision-maker and she wants to be full code.  Consults: none  Family Communication: none  Severity of Illness: The appropriate patient status for this patient is INPATIENT. Inpatient status is judged to be reasonable and necessary in order to provide the required intensity of service to ensure the patient's safety. The patient's presenting symptoms, physical exam  findings, and initial radiographic and laboratory data in the context of their chronic comorbidities is felt to place them at high risk for further clinical deterioration. Furthermore, it is not anticipated that the patient will be medically stable for discharge from the hospital within 2 midnights of admission.   * I certify that at the point of admission it is my clinical judgment that the patient will require inpatient hospital care spanning beyond 2 midnights from the point of admission due to high intensity of service, high risk for further deterioration and high frequency of surveillance required.*  Author: Buena Irish, MD 07/01/2023 2:10 AM  For on call review [email or web address].

## 2023-07-01 NOTE — TOC CM/SW Note (Signed)
 Transition of Care [facility]) - Inpatient Brief Assessment   Patient Details  Name: Tanya Perkins MRN: [id number] Date of Birth: 12/07/1980  [facility] of [facility]) CM/SW Contact:    Harriet Masson, RN Phone Number: 07/01/2023, 11:54 AM   Clinical Narrative:  Patient admitted from MD office for hypertension.  No TOC needs at this time.  Transition of Care Asessment: Insurance and Status: Insurance coverage has been reviewed Patient has primary care physician: Yes Home environment has been reviewed: safe to discharge home when medically ready Prior level of function:: independent Prior/Current Home Services: No current home services Social Drivers of Health Review: SDOH reviewed no interventions necessary Readmission risk has been reviewed: Yes Transition of care needs: no transition of care needs at this time

## 2023-07-02 ENCOUNTER — Other Ambulatory Visit: Payer: Federal, State, Local not specified - PPO

## 2023-07-02 DIAGNOSIS — I160 Hypertensive urgency: Secondary | ICD-10-CM | POA: Diagnosis not present

## 2023-07-02 DIAGNOSIS — E876 Hypokalemia: Secondary | ICD-10-CM | POA: Diagnosis not present

## 2023-07-02 DIAGNOSIS — N179 Acute kidney failure, unspecified: Secondary | ICD-10-CM | POA: Diagnosis not present

## 2023-07-02 DIAGNOSIS — I161 Hypertensive emergency: Principal | ICD-10-CM | POA: Diagnosis present

## 2023-07-02 LAB — BASIC METABOLIC PANEL
Anion gap: 11 (ref 5–15)
BUN: 9 mg/dL (ref 6–20)
CO2: 25 mmol/L (ref 22–32)
Calcium: 8.6 mg/dL — ABNORMAL LOW (ref 8.9–10.3)
Chloride: 103 mmol/L (ref 98–111)
Creatinine, Ser: 1.69 mg/dL — ABNORMAL HIGH (ref 0.44–1.00)
GFR, Estimated: 38 mL/min — ABNORMAL LOW (ref >60)
Glucose, Bld: 93 mg/dL (ref 70–99)
Potassium: 3.7 mmol/L (ref 3.5–5.1)
Sodium: 139 mmol/L (ref 135–145)

## 2023-07-02 LAB — CBC
HCT: 33.8 % — ABNORMAL LOW (ref 36.0–46.0)
Hemoglobin: 11.2 g/dL — ABNORMAL LOW (ref 12.0–15.0)
MCH: 27.2 pg (ref 26.0–34.0)
MCHC: 33.1 g/dL (ref 30.0–36.0)
MCV: 82 fL (ref 80.0–100.0)
Platelets: 378 10*3/uL (ref 150–400)
RBC: 4.12 MIL/uL (ref 3.87–5.11)
RDW: 13.8 % (ref 11.5–15.5)
WBC: 11.4 10*3/uL — ABNORMAL HIGH (ref 4.0–10.5)
nRBC: 0 % (ref 0.0–0.2)

## 2023-07-02 LAB — UREA NITROGEN, URINE: Urea Nitrogen, Ur: 469 mg/dL

## 2023-07-02 LAB — MAGNESIUM: Magnesium: 2 mg/dL (ref 1.7–2.4)

## 2023-07-02 LAB — PHOSPHORUS: Phosphorus: 3.5 mg/dL (ref 2.5–4.6)

## 2023-07-02 MED ORDER — AMLODIPINE BESYLATE 10 MG PO TABS
10.0000 mg | ORAL_TABLET | Freq: Every day | ORAL | Status: DC
  Administered 2023-07-02: 10 mg via ORAL
  Filled 2023-07-02: qty 1

## 2023-07-02 MED ORDER — AMLODIPINE BESYLATE 10 MG PO TABS: 10.0000 mg | ORAL_TABLET | Freq: Every day | ORAL | 2 refills | Status: DC

## 2023-07-02 NOTE — Discharge Summary (Signed)
 Physician Discharge Summary   Patient: Tanya Perkins MRN: [id number] DOB: 05/10/1980  Admit date:     06/30/2023  Discharge date: 07/02/23  Discharge Physician: MDALA-GAUSI, Gwenette Greet   PCP: Bary Leriche, PA-C   Recommendations at discharge:    Follow up with Nephrology  Discharge Diagnoses: Principal Problem:   Hypertensive urgency Active Problems:   Hypertensive emergency  Resolved Problems:   * No resolved hospital problems. *  Hospital Course: 43 year old woman with history of chronic sinusitis who presented to the ED from her doctor's office after she was found to have very elevated blood pressure of 236/144. Patient admitted with hypertensive emergency.  Hospital course is in problem based format below:  Hypertensive emergency Presented with BP 236/44. Evidence of endorgan damage with AKI. Patient had taken Sudafed prior to presentation. Patient received IV hydralazine, labetalol, and has been started on Norvasc. Advised to follow-up with her PCP and with nephrology. Patient advised not to take Sudafed.   AKI versus CKD AKI possibly due to hypertensive urgency. Patient presented with creatinine of 1.88.  Trended up after initial downward trend. Nephrology was consulted.  Felt AKI likely due to hypertensive emergency. Urine studies were done. Renal ultrasound showed mild bilateral hydronephrosis with increased echogenicity of the kidneys consistent with medical renal disease. Creatinine trended down and was 1.69 on the day of discharge. Patient is to follow-up with nephrology as outpatient. Advised to avoid nephrotoxins.      Consultants: Nephrology Procedures performed: n/a  Disposition: Home Diet recommendation:  Discharge Diet Orders (From admission, onward)     Start     Ordered   07/02/23 0000  Diet - low sodium heart healthy        07/02/23 0932           Regular diet DISCHARGE MEDICATION: Allergies as of 07/02/2023   No Known  Allergies      Medication List     STOP taking these medications    amoxicillin 500 MG capsule Commonly known as: AMOXIL   pseudoephedrine 30 MG tablet Commonly known as: SUDAFED       TAKE these medications    acetaminophen 500 MG tablet Commonly known as: TYLENOL Take 500-1,000 mg by mouth every 6 (six) hours as needed for moderate pain (pain score 4-6).   amLODipine 10 MG tablet Commonly known as: NORVASC Take 1 tablet (10 mg total) by mouth daily.   loratadine 10 MG tablet Commonly known as: CLARITIN Take 10 mg by mouth daily as needed for allergies.   MULTIVITAMIN ADULTS PO Take 1 tablet by mouth daily.        Discharge Exam: Filed Weights   06/30/23 2213  Weight: 80.9 kg   Physical Exam on Day of Discharge   General: Alert, cheerful, oriented X3  Oral cavity: moist mucous membranes  Neck: supple  Chest: clear to auscultation. No crackles, no wheezes  CVS: S1,S2 RRR. No murmurs  Abd: No distention, soft, non-tender. No masses palpable  Extr: No edema    Condition at discharge: good  The results of significant diagnostics from this hospitalization (including imaging, microbiology, ancillary and laboratory) are listed below for reference.   Imaging Studies: US RENAL Result Date: 07/02/2023 CLINICAL DATA:  Acute kidney injury EXAM: RENAL / URINARY TRACT ULTRASOUND COMPLETE COMPARISON:  CT abdomen and pelvis 07/03/2021 FINDINGS: Right Kidney: Renal measurements: 9.6 x 4.1 x 4.6 cm = volume: 94 mL. There is mild hydronephrosis. There is increased echogenicity. Left Kidney: Renal measurements: 10.2 x  4.8 x 3.3 cm = volume: 86 mL. There is mild hydronephrosis. There is increased echogenicity. Bladder: Appears normal for degree of bladder distention. Other: None. IMPRESSION: 1. Mild bilateral hydronephrosis. 2. Increased echogenicity of the kidneys consistent with medical renal disease. Electronically Signed   By: Darliss Cheney M.D.   On: 07/02/2023 01:31     Microbiology: Results for orders placed or performed during the hospital encounter of 06/30/23  MRSA Next Gen by PCR, Nasal     Status: None   Collection Time: 06/30/23 10:26 PM   Specimen: Nasal Mucosa; Nasal Swab  Result Value Ref Range Status   MRSA by PCR Next Gen NOT DETECTED NOT DETECTED Final    Comment: (NOTE) The GeneXpert MRSA Assay (FDA approved for NASAL specimens only), is one component of a comprehensive MRSA colonization surveillance program. It is not intended to diagnose MRSA infection nor to guide or monitor treatment for MRSA infections. Test performance is not FDA approved in patients less than 33 years old. Performed at [facility], [address] [place], [place] [address]     Labs: CBC: Recent Labs  Lab 06/30/23 1525 07/02/23 0213  WBC 13.6* 11.4*  NEUTROABS 10.4*  --   HGB 12.5 11.2*  HCT 37.5 33.8*  MCV 80.5 82.0  PLT 484* 378   Basic Metabolic Panel: Recent Labs  Lab 06/30/23 1525 07/01/23 0222 07/01/23 1406 07/02/23 0213  NA 139 139 138 139  K 3.2* 3.4* 3.8 3.7  CL 101 104 106 103  CO2 28 22 25 25   GLUCOSE 96 96 96 93  BUN 14 13 13 9   CREATININE 1.88* 1.82* 1.90* 1.69*  CALCIUM 9.0 8.7* 8.6* 8.6*  MG  --  1.9  --  2.0  PHOS  --   --   --  3.5   Liver Function Tests: No results for input(s): "AST", "ALT", "ALKPHOS", "BILITOT", "PROT", "ALBUMIN" in the last 168 hours. CBG: No results for input(s): "GLUCAP" in the last 168 hours.  Discharge time spent: greater than 30 minutes.  Signed: MDALA-GAUSI, Gwenette Greet, MD [facility] 07/02/2023

## 2023-07-02 NOTE — Plan of Care (Signed)

## 2023-07-02 NOTE — Plan of Care (Signed)
  Problem: Education: Goal: Knowledge of General Education information will improve Description: Including pain rating scale, medication(s)/side effects and non-pharmacologic comfort measures Outcome: Progressing   Problem: Clinical Measurements: Goal: Ability to maintain clinical measurements within normal limits will improve Outcome: Progressing Goal: Will remain free from infection Outcome: Progressing Goal: Diagnostic test results will improve Outcome: Progressing Goal: Cardiovascular complication will be avoided Outcome: Progressing   Problem: Activity: Goal: Risk for activity intolerance will decrease Outcome: Progressing   Problem: Nutrition: Goal: Adequate nutrition will be maintained Outcome: Progressing   

## 2023-07-02 NOTE — Progress Notes (Signed)
 [facility] Progress Note    Assessment/ Plan:   AKI -last known Cr from 2023 was 1.15. AKI likely secondary to hemodynamic insults from HTN urgency which widely fluctuated thereafter. Her Cr is down to 1.69 today. -U/S did show mild hydronephrosis. Given improvement in Cr, may just repeat this as an outpatient. -does have microscopic hematuria but when in HTN urgency, repeat sediment better. GN serologies pending--will follow up results as an outpatient. With proteinuria as well but in the context of HTN/AKI, will repeat as an outpatient -encouraged oral hydration in the interim -Avoid nephrotoxic medications including NSAIDs and iodinated intravenous contrast exposure unless the latter is absolutely indicated.  Preferred narcotic agents for pain control are hydromorphone, fentanyl, and methadone. Morphine should not be used. Avoid Baclofen and avoid oral sodium phosphate and magnesium citrate based laxatives / bowel preps. Continue strict Input and Output monitoring. Will monitor the patient closely with you and intervene or adjust therapy as indicated by changes in clinical status/labs    HTN urgency -her BP is currently acceptable as compared to when she first came in, agree with norvasc---dose increased to 10mg  daily today. Recommendation for next step (if needed) is adding a beta blocker I.e. coreg -plan for renal artery duplex as an outpatient -will check renin/aldo while she's here and follow up as an outpatient   Hypokalemia -resolved  Nothing else to add from an inpatient nephrology perspective. Will sign off. Will arrange for follow up at our office in 2 weeks. Please call with any questions/concerns in the interim.  Subjective:   Patient seen and examined bedside. No acute events/complaints. Discussed mediterranean/low sodium diet, discussed on obtaining upper arm BP cuff for home. Has been staying hydrated   Objective:   BP (!) 163/100 (BP Location: Right Arm)    Pulse 87   Temp 98.4 F (36.9 C) (Oral)   Resp 15   Ht 5\' 3"  (1.6 m)   Wt 80.9 kg   LMP 06/17/2023 (Exact Date)   SpO2 96%   BMI 31.60 kg/m   Intake/Output Summary (Last 24 hours) at 07/02/2023 0758 Last data filed at 07/01/2023 1100 Gross per 24 hour  Intake 240 ml  Output --  Net 240 ml   Weight change:   Physical Exam: Gen: NAD CVS: RRR Resp: normal wob  Abd: ND Ext: no edema Neuro: awake, alert  Imaging: US RENAL Result Date: 07/02/2023 CLINICAL DATA:  Acute kidney injury EXAM: RENAL / URINARY TRACT ULTRASOUND COMPLETE COMPARISON:  CT abdomen and pelvis 07/03/2021 FINDINGS: Right Kidney: Renal measurements: 9.6 x 4.1 x 4.6 cm = volume: 94 mL. There is mild hydronephrosis. There is increased echogenicity. Left Kidney: Renal measurements: 10.2 x 4.8 x 3.3 cm = volume: 86 mL. There is mild hydronephrosis. There is increased echogenicity. Bladder: Appears normal for degree of bladder distention. Other: None. IMPRESSION: 1. Mild bilateral hydronephrosis. 2. Increased echogenicity of the kidneys consistent with medical renal disease. Electronically Signed   By: Darliss Cheney M.D.   On: 07/02/2023 01:31    Labs: BMET Recent Labs  Lab 06/30/23 1525 07/01/23 0222 07/01/23 1406 07/02/23 0213  NA 139 139 138 139  K 3.2* 3.4* 3.8 3.7  CL 101 104 106 103  CO2 28 22 25 25   GLUCOSE 96 96 96 93  BUN 14 13 13 9   CREATININE 1.88* 1.82* 1.90* 1.69*  CALCIUM 9.0 8.7* 8.6* 8.6*  PHOS  --   --   --  3.5   CBC Recent Labs  Lab 06/30/23 1525 07/02/23 0213  WBC 13.6* 11.4*  NEUTROABS 10.4*  --   HGB 12.5 11.2*  HCT 37.5 33.8*  MCV 80.5 82.0  PLT 484* 378    Medications:     amLODipine  10 mg Oral Daily   enoxaparin (LOVENOX) injection  40 mg Subcutaneous Q24H      Anthony Sar, MD [facility] 07/02/2023, 7:58 AM

## 2023-07-02 NOTE — Plan of Care (Signed)
  Problem: Education: Goal: Knowledge of General Education information will improve Description: Including pain rating scale, medication(s)/side effects and non-pharmacologic comfort measures Outcome: Adequate for Discharge   Problem: Health Behavior/Discharge Planning: Goal: Ability to manage health-related needs will improve Outcome: Adequate for Discharge   Problem: Clinical Measurements: Goal: Ability to maintain clinical measurements within normal limits will improve Outcome: Adequate for Discharge Goal: Will remain free from infection Outcome: Adequate for Discharge Goal: Diagnostic test results will improve Outcome: Adequate for Discharge Goal: Respiratory complications will improve Outcome: Adequate for Discharge Goal: Cardiovascular complication will be avoided Outcome: Adequate for Discharge   Problem: Activity: Goal: Risk for activity intolerance will decrease 07/02/2023 1007 by Garth Bigness, RN Outcome: Adequate for Discharge 07/02/2023 0844 by Garth Bigness, RN Outcome: Progressing   Problem: Nutrition: Goal: Adequate nutrition will be maintained 07/02/2023 1007 by Garth Bigness, RN Outcome: Adequate for Discharge 07/02/2023 0844 by Garth Bigness, RN Outcome: Progressing   Problem: Coping: Goal: Level of anxiety will decrease 07/02/2023 1007 by Garth Bigness, RN Outcome: Adequate for Discharge 07/02/2023 0844 by Garth Bigness, RN Outcome: Progressing   Problem: Elimination: Goal: Will not experience complications related to bowel motility 07/02/2023 1007 by Garth Bigness, RN Outcome: Adequate for Discharge 07/02/2023 0844 by Garth Bigness, RN Outcome: Progressing Goal: Will not experience complications related to urinary retention 07/02/2023 1007 by Garth Bigness, RN Outcome: Adequate for Discharge 07/02/2023 0844 by Garth Bigness, RN Outcome: Progressing   Problem: Pain Managment: Goal: General experience of comfort  will improve and/or be controlled 07/02/2023 1007 by Garth Bigness, RN Outcome: Adequate for Discharge 07/02/2023 0844 by Garth Bigness, RN Outcome: Progressing   Problem: Safety: Goal: Ability to remain free from injury will improve 07/02/2023 1007 by Garth Bigness, RN Outcome: Adequate for Discharge 07/02/2023 0844 by Garth Bigness, RN Outcome: Progressing   Problem: Skin Integrity: Goal: Risk for impaired skin integrity will decrease 07/02/2023 1007 by Garth Bigness, RN Outcome: Adequate for Discharge 07/02/2023 0844 by Garth Bigness, RN Outcome: Progressing

## 2023-07-02 NOTE — TOC Transition Note (Signed)
 Transition of Care [facility]) - Discharge Note   Patient Details  Name: Tanya Perkins MRN: [id number] Date of Birth: 02/21/1981  [facility]) CM/SW Contact:  Harriet Masson, RN Phone Number: 07/02/2023, 9:44 AM   Clinical Narrative:    Patient stable to discharge home.  No TOC needs at this time.    Final next level of care: Home/Self Care Barriers to Discharge: Barriers Resolved   Patient Goals and CMS Choice Patient states their goals for this hospitalization and ongoing recovery are:: return home          Discharge Placement               Home        Discharge Plan and Services Additional resources added to the After Visit Summary for                                       Social Drivers of Health (SDOH) Interventions SDOH Screenings   Food Insecurity: No Food Insecurity (06/30/2023)  Housing: Low Risk  (06/30/2023)  Transportation Needs: No Transportation Needs (06/30/2023)  Utilities: Not At Risk (06/30/2023)  Depression (PHQ2-9): Low Risk  (06/30/2023)  Social Connections: Moderately Integrated (06/30/2023)  Tobacco Use: Medium Risk (06/30/2023)     Readmission Risk Interventions    07/02/2023    9:43 AM  Readmission Risk Prevention Plan  Post Dischage Appt Complete  Medication Screening Complete  Transportation Screening Complete

## 2023-07-03 LAB — ANCA PROFILE
Anti-MPO Antibodies: <0.2 U (ref 0.0–0.9)
Anti-PR3 Antibodies: <0.2 U (ref 0.0–0.9)
Atypical P-ANCA titer: <1:20 {titer}
C-ANCA: <1:20 {titer}
P-ANCA: <1:20 {titer}

## 2023-07-03 LAB — ANTI-DNA ANTIBODY, DOUBLE-STRANDED: ds DNA Ab: <1 [IU]/mL (ref 0–9)

## 2023-07-03 LAB — ANA W/REFLEX IF POSITIVE: Anti Nuclear Antibody (ANA): NEGATIVE

## 2023-07-04 LAB — C3 COMPLEMENT: C3 Complement: 144 mg/dL (ref 82–167)

## 2023-07-04 LAB — C4 COMPLEMENT: Complement C4, Body Fluid: 18 mg/dL (ref 12–38)

## 2023-07-08 LAB — ALDOSTERONE + RENIN ACTIVITY W/ RATIO
ALDO / PRA Ratio: 1.9 (ref 0.0–30.0)
Aldosterone: 3.5 ng/dL (ref 0.0–30.0)
PRA LC/MS/MS: 1.813 ng/mL/h (ref 0.167–5.380)

## 2023-07-15 ENCOUNTER — Encounter: Payer: Self-pay | Admitting: Physician Assistant

## 2023-07-15 ENCOUNTER — Ambulatory Visit: Payer: Federal, State, Local not specified - PPO | Admitting: Physician Assistant

## 2023-07-15 VITALS — BP 130/82 | HR 82 | Temp 97.8°F | Ht 63.0 in | Wt 172.8 lb

## 2023-07-15 DIAGNOSIS — N179 Acute kidney failure, unspecified: Secondary | ICD-10-CM | POA: Diagnosis not present

## 2023-07-15 DIAGNOSIS — I129 Hypertensive chronic kidney disease with stage 1 through stage 4 chronic kidney disease, or unspecified chronic kidney disease: Secondary | ICD-10-CM | POA: Diagnosis not present

## 2023-07-15 DIAGNOSIS — I160 Hypertensive urgency: Secondary | ICD-10-CM

## 2023-07-15 NOTE — Progress Notes (Signed)
 Chief Complaint:  Tanya Perkins is a 43 y.o. female who presents today for a TCM visit.   Subjective:  HPI:  Summary of Hospital admission: Reason for admission: Hypertensive urgency Date of admission: 06/30/23 Date of discharge: 07/02/23 Date of Interactive contact: Unknown  Discussed the use of AI scribe software for clinical note transcription with the patient, who gave verbal consent to proceed.  History of Present Illness   She is a 43 year old with hypertension who presents for follow-up after a hypertensive emergency and acute kidney injury. She is accompanied by her mother.  She experienced a hypertensive emergency that resulted in acute kidney injury. During this episode, her blood pressure was extremely high, attributed to the use of allergy and sinus medications, specifically Claritin and Sudafed. This led to her hospitalization.  Her blood pressure is currently well-controlled with amlodipine, and she is not experiencing dizziness or lightheadedness. No swelling in her legs, feet, or ankles noted with amlodipine use.  A renal ultrasound showed mild kidney disease, and she is following up with nephrology to ensure her kidney levels are stable.  She has switched to using Tylenol for pain management. Her mother has been assisting with her diet, ensuring she consumes appropriate meals, which has contributed to her improved blood pressure control.  She has a history of thyroid issues, with only a small piece of her thyroid remaining, and her thyroid levels are regularly monitored. There is a family history of thyroid problems.       ROS: A complete review of systems was negative.   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   Patient Active Problem List   Diagnosis Date Noted    Hypertensive emergency 07/02/2023   Hypertensive urgency 06/30/2023   Pain in joint of right shoulder 04/09/2023   Basal cell carcinoma of back 07/01/2021   Thyroid enlargement    Past Surgical History:  Procedure Laterality Date   MYOMECTOMY ABDOMINAL APPROACH  05/2007   THYROID LOBECTOMY     LEFT - benign tumor   Tooth #20 implant      Family History  Problem Relation Age of Onset   Thyroid disease Mother        ?due to accident   Hypertension Father    Hyperlipidemia Father    Heart attack Father    Hypertension Brother    Hyperlipidemia Brother    Diabetes Maternal Aunt    Hypertension Maternal Aunt    Diabetes Maternal Uncle    Hypertension Maternal Uncle    Hypertension Paternal Aunt    Hypertension Paternal Uncle    Diabetes Maternal Grandmother    Stroke Maternal Grandmother    Leukemia Maternal Grandfather    Cancer Maternal Grandfather        lung   Hypertension Maternal Grandfather    Breast cancer Paternal Grandmother        Age 88's   Stroke Paternal Grandfather    Hypertension Paternal Grandfather    Diabetes Cousin    Asthma Cousin     Medications- Reconciled discharge and current medications in Epic.  Current Outpatient Medications  Medication Sig Dispense Refill   acetaminophen (TYLENOL) 500 MG tablet Take 500-1,000 mg by mouth every 6 (six) hours as needed for moderate pain (pain  score 4-6).     amLODipine (NORVASC) 10 MG tablet Take 1 tablet (10 mg total) by mouth daily. 30 tablet 2   Multiple Vitamins-Minerals (MULTIVITAMIN ADULTS PO) Take 1 tablet by mouth daily.     loratadine (CLARITIN) 10 MG tablet Take 10 mg by mouth daily as needed for allergies. (Patient not taking: Reported on 07/15/2023)     No current facility-administered medications for this visit.    Allergies-reviewed and updated No Known Allergies  Social History   Socioeconomic History   Marital status: Single    Spouse name: Not on file   Number of children: Not on  file   Years of education: Not on file   Highest education level: Not on file  Occupational History   Not on file  Tobacco Use   Smoking status: Former   Smokeless tobacco: Never  Vaping Use   Vaping status: Never Used  Substance and Sexual Activity   Alcohol use: Not Currently    Comment: Rare   Drug use: No   Sexual activity: Not Currently    Partners: Male    Birth control/protection: None  Other Topics Concern   Not on file  Social History Narrative   1 son 2003- Tanya Perkins, mother is Tanya Perkins   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, multimedia   Single   Enjoys sleeping, watching her son's baseball   Social Drivers of Corporate investment banker Strain: Not on file  Food Insecurity: No Food Insecurity (06/30/2023)   Hunger Vital Sign    Worried About Running Out of Food in the Last Year: Never true    Ran Out of Food in the Last Year: Never true  Transportation Needs: No Transportation Needs (06/30/2023)   PRAPARE - Administrator, Civil Service (Medical): No    Lack of Transportation (Non-Medical): No  Physical Activity: Not on file  Stress: Not on file  Social Connections: Moderately Integrated (06/30/2023)   Social Connection and Isolation Panel [NHANES]    Frequency of Communication with Friends and Family: More than three times a week    Frequency of Social Gatherings with Friends and Family: More than three times a week    Attends Religious Services: More than 4 times per year    Active Member of [employer] or Organizations: Yes    Attends Banker Meetings: 1 to 4 times per year    Marital Status: Never married        Objective:  Physical Exam: BP 130/82   Pulse 82   Temp 97.8 F (36.6 C) (Temporal)   Ht 5\' 3"  (1.6 m)   Wt 172 lb 12.8 oz (78.4 kg)   LMP 06/17/2023 (Exact Date)   SpO2 98%   BMI 30.61 kg/m   Gen: NAD, resting comfortably CV: RRR with no murmurs appreciated Pulm: NWOB, CTAB with no crackles, wheezes, or  rhonchi MSK: No edema, cyanosis, or clubbing noted Skin: Warm, dry Neuro: Grossly normal, moves all extremities Psych: Normal affect and thought content  Assessment/Plan:  Assessment and Plan    Hypertension Blood pressure controlled with amlodipine. - Continue amlodipine 10 mg. Pt aware of risks vs benefits and possible adverse reactions. - Avoid Sudafed and NSAIDs. - Monitor for peripheral edema. - Consider Coricidin HBP for cold symptoms in future.  Acute Kidney Injury (AKI) AKI secondary to hypertensive emergency. Renal ultrasound shows mild kidney disease. Nephrology follow-up scheduled. - Attend nephrology appointment. - Avoid NSAIDs and Sudafed.  General Health Maintenance Up to date with Pap smear and mammogram. No early colon cancer screening needed. Normal thyroid function post-thyroidectomy. - Schedule annual mammogram in July. - Continue regular thyroid function monitoring.      Summary/Review of work up during hospitalization: Labs, Renal US       Return in about 6 months (around 01/15/2024) for blood pressure check, recheck/follow-up.   Mersadie Kavanaugh, PA-C

## 2023-07-17 ENCOUNTER — Other Ambulatory Visit: Payer: Self-pay | Admitting: Nephrology

## 2023-07-17 DIAGNOSIS — N179 Acute kidney failure, unspecified: Secondary | ICD-10-CM

## 2023-07-17 LAB — LAB REPORT - SCANNED: EGFR: 29

## 2023-08-04 DIAGNOSIS — I129 Hypertensive chronic kidney disease with stage 1 through stage 4 chronic kidney disease, or unspecified chronic kidney disease: Secondary | ICD-10-CM | POA: Diagnosis not present

## 2023-08-04 DIAGNOSIS — R809 Proteinuria, unspecified: Secondary | ICD-10-CM | POA: Diagnosis not present

## 2023-08-04 DIAGNOSIS — R319 Hematuria, unspecified: Secondary | ICD-10-CM | POA: Diagnosis not present

## 2023-08-04 DIAGNOSIS — N179 Acute kidney failure, unspecified: Secondary | ICD-10-CM | POA: Diagnosis not present

## 2023-08-17 ENCOUNTER — Other Ambulatory Visit (HOSPITAL_COMMUNITY): Payer: Self-pay | Admitting: Nephrology

## 2023-08-17 DIAGNOSIS — I10 Essential (primary) hypertension: Secondary | ICD-10-CM

## 2023-09-03 ENCOUNTER — Other Ambulatory Visit: Payer: Self-pay | Admitting: Student

## 2023-09-03 ENCOUNTER — Other Ambulatory Visit: Payer: Self-pay | Admitting: Radiology

## 2023-09-03 DIAGNOSIS — R809 Proteinuria, unspecified: Secondary | ICD-10-CM

## 2023-09-03 NOTE — Progress Notes (Signed)
 Chief Complaint: Proteinuria; image guided random renal biopsy  Referring Provider(s): Singh,Vikas   Supervising Physician: Marland Silvas  Patient Status: [facility] - Out-pt  History of Present Illness: Tanya Perkins is a 43 y.o. female with past medical history of thyroid  mass with left thyroid  lobectomy, uterine fibroids. She was referred to [facility] for f/u after admission for hypertensive emergency 2/25-2/27/25. Her creatinine was 1.88, BP 236/144 at admission. Creat improved to 1.69 after treatment for hypertensive emergency. However, at f/u appt with nephrology, creat 2.13 despite continued BP improvement with home medication regimen. Renal US  notes mild hydronephrosis and increased echogenicity consistent with medical renal disease. ANA/ANCA neg. IR consulted for image guided random renal biopsy to guide treatment plan.   She is lying in bed with mother at bedside at time of exam. Has been NPO since MN, mother will remain with pt for 24 hr/drive. Denies any complaint today (see below for full ROS). Reports that her BP has been far improved, typically SBP 120-130 since new med management at home.   Does not use a CPAP, RA baseline.   Patient is Full Code  Past Medical History:  Diagnosis Date   Fibroid    uterine   History of chicken pox    Thyroid  disease    Thyroid  mass 2011   ONCOCYTIC ADENOMA--NON MALIGNANT    Past Surgical History:  Procedure Laterality Date   MYOMECTOMY ABDOMINAL APPROACH  05/2007   THYROID  LOBECTOMY     LEFT - benign tumor   Tooth #20 implant      Allergies: Patient has no known allergies.  Medications: Prior to Admission medications   Medication Sig Start Date End Date Taking? Authorizing Provider  acetaminophen  (TYLENOL ) 500 MG tablet Take 500-1,000 mg by mouth every 6 (six) hours as needed for moderate pain (pain score 4-6).    [provider]  amLODipine  (NORVASC ) 10 MG tablet Take 1 tablet (10 mg total) by mouth  daily. 07/02/23   Mdala-Gausi, Masiku Agatha, MD  loratadine (CLARITIN) 10 MG tablet Take 10 mg by mouth daily as needed for allergies. Patient not taking: Reported on 07/15/2023    [provider]  Multiple Vitamins-Minerals (MULTIVITAMIN ADULTS PO) Take 1 tablet by mouth daily.    [provider]     Family History  Problem Relation Age of Onset   Thyroid  disease Mother        ?due to accident   Hypertension Father    Hyperlipidemia Father    Heart attack Father    Hypertension Brother    Hyperlipidemia Brother    Diabetes Maternal Aunt    Hypertension Maternal Aunt    Diabetes Maternal Uncle    Hypertension Maternal Uncle    Hypertension Paternal Aunt    Hypertension Paternal Uncle    Diabetes Maternal Grandmother    Stroke Maternal Grandmother    Leukemia Maternal Grandfather    Cancer Maternal Grandfather        lung   Hypertension Maternal Grandfather    Breast cancer Paternal Grandmother        Age 60's   Stroke Paternal Grandfather    Hypertension Paternal Grandfather    Diabetes Cousin    Asthma Cousin     Social History   Socioeconomic History   Marital status: Single    Spouse name: Not on file   Number of children: Not on file   Years of education: Not on file   Highest education level: Not on file  Occupational History   Not on file  Tobacco Use   Smoking status: Former   Smokeless tobacco: Never  Vaping Use   Vaping status: Never Used  Substance and Sexual Activity   Alcohol use: Not Currently    Comment: Rare   Drug use: No   Sexual activity: Not Currently    Partners: Male    Birth control/protection: None  Other Topics Concern   Not on file  Social History Narrative   1 son 2003- Arlene Ben, mother is Ivana Maris   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, multimedia   Single   Enjoys sleeping, watching her son's baseball   Social Drivers of Corporate investment banker Strain: Not on file  Food Insecurity: No Food  Insecurity (06/30/2023)   Hunger Vital Sign    Worried About Running Out of Food in the Last Year: Never true    Ran Out of Food in the Last Year: Never true  Transportation Needs: No Transportation Needs (06/30/2023)   PRAPARE - Administrator, Civil Service (Medical): No    Lack of Transportation (Non-Medical): No  Physical Activity: Not on file  Stress: Not on file  Social Connections: Moderately Integrated (06/30/2023)   Social Connection and Isolation Panel [NHANES]    Frequency of Communication with Friends and Family: More than three times a week    Frequency of Social Gatherings with Friends and Family: More than three times a week    Attends Religious Services: More than 4 times per year    Active Member of [employer] or Organizations: Yes    Attends Banker Meetings: 1 to 4 times per year    Marital Status: Never married     Review of Systems:  Review of Systems  Constitutional:  Negative for chills and fever.  HENT:  Negative for sore throat.   Respiratory:  Negative for chest tightness and shortness of breath.   Cardiovascular:  Negative for chest pain and leg swelling.  Gastrointestinal:  Negative for abdominal distention, abdominal pain, blood in stool, diarrhea, nausea and vomiting.  Genitourinary:  Negative for hematuria.  Skin:  Negative for wound.  Neurological:  Negative for dizziness, light-headedness and headaches.  Hematological:  Does not bruise/bleed easily.  Psychiatric/Behavioral:  Negative for confusion.     Vital Signs: BP (!) 149/82   Pulse 70   Temp 98.1 F (36.7 C) (Oral)   Resp 20   Ht 5\' 3"  (1.6 m)   Wt 165 lb (74.8 kg)   LMP 08/30/2023 (Exact Date)   SpO2 99%   BMI 29.23 kg/m     Physical Exam Constitutional:      Appearance: Normal appearance.  HENT:     Mouth/Throat:     Mouth: Mucous membranes are moist.     Pharynx: Oropharynx is clear.  Cardiovascular:     Rate and Rhythm: Normal rate and regular rhythm.      Pulses: Normal pulses.     Heart sounds: Normal heart sounds.  Pulmonary:     Effort: Pulmonary effort is normal.     Breath sounds: Normal breath sounds.  Abdominal:     Palpations: Abdomen is soft.     Tenderness: There is no abdominal tenderness.  Musculoskeletal:     Comments: Nonpitting edema BLE  Skin:    General: Skin is warm and dry.     Capillary Refill: Capillary refill takes less than 2 seconds.     Coloration: Skin is  not jaundiced.     Comments: No rash or wound to L or R flank  Neurological:     Mental Status: She is alert and oriented to person, place, and time.  Psychiatric:        Mood and Affect: Mood normal.        Behavior: Behavior normal.        Thought Content: Thought content normal.        Judgment: Judgment normal.     Imaging: No results found.  Labs:  CBC: Recent Labs    06/30/23 1525 07/02/23 0213 09/04/23 0656  WBC 13.6* 11.4* 7.7  HGB 12.5 11.2* 12.1  HCT 37.5 33.8* 38.0  PLT 484* 378 389    COAGS: Recent Labs    09/04/23 0656  INR 1.0    BMP: Recent Labs    06/30/23 1525 07/01/23 0222 07/01/23 1406 07/02/23 0213  NA 139 139 138 139  K 3.2* 3.4* 3.8 3.7  CL 101 104 106 103  CO2 28 22 25 25   GLUCOSE 96 96 96 93  BUN 14 13 13 9   CALCIUM 9.0 8.7* 8.6* 8.6*  CREATININE 1.88* 1.82* 1.90* 1.69*  GFRNONAA 34* 35* 33* 38*    Assessment and Plan:  Request for  image guided random renal biopsy No contraindications for procedure identified in ROS, physical exam, or review of pre-sedation considerations. Labs reviewed and within acceptable range 07/02/23 Renal US  imaging available and reviewed VSS, afebrile.    Risks and benefits of image guided random renal biopsy was discussed with the patient and patient's family including, but not limited to bleeding possibly requiring additional procedure and hospitalization, infection, damage to adjacent structures or low yield requiring additional tests.  All of the questions  were answered and there is agreement to proceed.  Consent signed and in chart.   Thank you for allowing our service to participate in Ami Cayton 's care.    Electronically Signed: Terressa Fess, NP   09/04/2023, 7:40 AM     I spent a total of  30 Minutes   in face to face in clinical consultation, greater than 50% of which was counseling/coordinating care for image guided random renal biopsy   (A copy of this note was sent to the referring provider and the time of visit.)

## 2023-09-04 ENCOUNTER — Other Ambulatory Visit: Payer: Self-pay

## 2023-09-04 ENCOUNTER — Ambulatory Visit (HOSPITAL_COMMUNITY)
Admission: RE | Admit: 2023-09-04 | Discharge: 2023-09-04 | Disposition: A | Discharge status: Home / Self Care | Source: Ambulatory Visit | Attending: Nephrology | Admitting: Nephrology

## 2023-09-04 DIAGNOSIS — I10 Essential (primary) hypertension: Secondary | ICD-10-CM | POA: Diagnosis not present

## 2023-09-04 DIAGNOSIS — R809 Proteinuria, unspecified: Secondary | ICD-10-CM | POA: Diagnosis not present

## 2023-09-04 DIAGNOSIS — Z86018 Personal history of other benign neoplasm: Secondary | ICD-10-CM | POA: Diagnosis not present

## 2023-09-04 DIAGNOSIS — Z87891 Personal history of nicotine dependence: Secondary | ICD-10-CM | POA: Diagnosis not present

## 2023-09-04 LAB — PROTIME-INR
INR: 1 (ref 0.8–1.2)
Prothrombin Time: 12.9 s (ref 11.4–15.2)

## 2023-09-04 LAB — CBC
HCT: 38 % (ref 36.0–46.0)
Hemoglobin: 12.1 g/dL (ref 12.0–15.0)
MCH: 26.6 pg (ref 26.0–34.0)
MCHC: 31.8 g/dL (ref 30.0–36.0)
MCV: 83.5 fL (ref 80.0–100.0)
Platelets: 389 10*3/uL (ref 150–400)
RBC: 4.55 MIL/uL (ref 3.87–5.11)
RDW: 12.7 % (ref 11.5–15.5)
WBC: 7.7 10*3/uL (ref 4.0–10.5)
nRBC: 0 % (ref 0.0–0.2)

## 2023-09-04 LAB — PREGNANCY, URINE: Preg Test, Ur: NEGATIVE

## 2023-09-04 MED ORDER — HYDRALAZINE HCL 20 MG/ML IJ SOLN
INTRAMUSCULAR | Status: AC
  Filled 2023-09-04: qty 1

## 2023-09-04 MED ORDER — MIDAZOLAM HCL 2 MG/2ML IJ SOLN
INTRAMUSCULAR | Status: AC
  Filled 2023-09-04: qty 2

## 2023-09-04 MED ORDER — FENTANYL CITRATE (PF) 100 MCG/2ML IJ SOLN
INTRAMUSCULAR | Status: AC | PRN
  Administered 2023-09-04 (×2): 25 ug via INTRAVENOUS

## 2023-09-04 MED ORDER — HYDROCODONE-ACETAMINOPHEN 5-325 MG PO TABS: 1.0000 | ORAL_TABLET | ORAL | Status: DC | PRN

## 2023-09-04 MED ORDER — SODIUM CHLORIDE 0.9 % IV SOLN
INTRAVENOUS | Status: AC | PRN
Start: 2023-09-04 — End: 2023-09-04
  Administered 2023-09-04: 250 mL via INTRAVENOUS

## 2023-09-04 MED ORDER — FENTANYL CITRATE (PF) 100 MCG/2ML IJ SOLN
INTRAMUSCULAR | Status: AC
  Filled 2023-09-04: qty 2

## 2023-09-04 MED ORDER — LIDOCAINE HCL (PF) 1 % IJ SOLN
10.0000 mL | Freq: Once | INTRAMUSCULAR | Status: AC
  Administered 2023-09-04: 10 mL

## 2023-09-04 MED ORDER — MIDAZOLAM HCL 2 MG/2ML IJ SOLN
INTRAMUSCULAR | Status: AC | PRN
  Administered 2023-09-04: .5 mg via INTRAVENOUS
  Administered 2023-09-04: 1 mg via INTRAVENOUS

## 2023-09-04 MED ORDER — HYDRALAZINE HCL 20 MG/ML IJ SOLN
INTRAMUSCULAR | Status: AC | PRN
  Administered 2023-09-04 (×3): 5 mg via INTRAVENOUS

## 2023-09-04 MED ORDER — SODIUM CHLORIDE 0.9 % IV SOLN: INTRAVENOUS | Status: DC

## 2023-09-04 NOTE — Procedures (Signed)
  Procedure:  US  core renal biopsy LLP 18g Preprocedure diagnosis: Diagnoses of Hypertension with albuminuria and Proteinuria, unspecified type were pertinent to this visit. Postprocedure diagnosis: same EBL:    minimal Complications:   none immediate  See full dictation in [employer].  Nicky Barrack MD Main # [phone] Pager  [phone] Mobile [phone]

## 2023-09-14 LAB — SURGICAL PATHOLOGY

## 2023-09-21 ENCOUNTER — Other Ambulatory Visit (HOSPITAL_COMMUNITY)
Admission: RE | Admit: 2023-09-21 | Discharge: 2023-09-21 | Disposition: A | Discharge status: Home / Self Care | Source: Ambulatory Visit | Attending: Certified Nurse Midwife | Admitting: Certified Nurse Midwife

## 2023-09-21 ENCOUNTER — Ambulatory Visit (HOSPITAL_BASED_OUTPATIENT_CLINIC_OR_DEPARTMENT_OTHER): Admitting: Certified Nurse Midwife

## 2023-09-21 ENCOUNTER — Encounter (HOSPITAL_BASED_OUTPATIENT_CLINIC_OR_DEPARTMENT_OTHER): Payer: Self-pay | Admitting: Certified Nurse Midwife

## 2023-09-21 VITALS — BP 148/95 | HR 83 | Ht 63.0 in | Wt 169.0 lb

## 2023-09-21 DIAGNOSIS — Z124 Encounter for screening for malignant neoplasm of cervix: Secondary | ICD-10-CM | POA: Insufficient documentation

## 2023-09-21 DIAGNOSIS — Z01419 Encounter for gynecological examination (general) (routine) without abnormal findings: Secondary | ICD-10-CM | POA: Diagnosis not present

## 2023-09-21 NOTE — Progress Notes (Signed)
 Son 44yo Good supportive system Not sexually active Works for [employer])  Periods typically regular (had 2 in April)  Pt was diagnosed with severe HTN unexpectedly 3 months ago, had recent kidney biopsy BP today 148/95   43 y.o. G1P1001 Single White or Caucasian female here for annual exam.    Patient's last menstrual period was 08/30/2023 (exact date).          Sexually active: No.     Exercising: Yes.     Smoker:  no  Health Maintenance: Pap:  ?2017 History of abnormal Pap:  no MMG:  11/11/22 Colonoscopy:  Age 43    reports that she has quit smoking. She has never used smokeless tobacco. She reports that she does not currently use alcohol. She reports that she does not use drugs.  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     amLODipine  (NORVASC ) 10 MG tablet Take 1 tablet (10 mg total) by mouth daily. 30 tablet 2   carvedilol  (COREG ) 6.25 MG tablet Take 6.25 mg by mouth 2 (two) times daily with a meal.     Multiple Vitamins-Minerals (MULTIVITAMIN ADULTS PO) Take 1 tablet by mouth daily.     No current facility-administered medications for this visit.    Family History  Problem Relation Age of Onset   Thyroid  disease Mother        ?due to accident   Hypertension Father    Hyperlipidemia Father    Heart attack Father    Hypertension Brother    Hyperlipidemia Brother    Diabetes Maternal Aunt    Hypertension Maternal Aunt    Diabetes Maternal Uncle    Hypertension Maternal  Uncle    Hypertension Paternal Aunt    Hypertension Paternal Uncle    Diabetes Maternal Grandmother    Stroke Maternal Grandmother    Leukemia Maternal Grandfather    Cancer Maternal Grandfather        lung   Hypertension Maternal Grandfather    Breast cancer Paternal Grandmother        Age 77's   Stroke Paternal Grandfather    Hypertension Paternal Grandfather    Diabetes Cousin    Asthma Cousin     ROS: Constitutional: negative Genitourinary:negative  Exam:   BP (!) 148/95 (BP Location: Right Arm, Patient Position: Sitting, Cuff Size: Large) Comment: Patient is on Blood pressure pills  Pulse 83   Ht 5\' 3"  (1.6 m)   Wt 169 lb (76.7 kg)   LMP 08/30/2023 (Exact Date)   BMI 29.94 kg/m   Height: 5\' 3"  (160 cm)  General appearance: alert, cooperative and appears stated age Head: Normocephalic, without obvious abnormality, atraumatic Lungs: clear to auscultation bilaterally Breasts: normal appearance, no masses or tenderness, Inspection negative, No nipple retraction or dimpling, No nipple discharge or bleeding, No axillary or supraclavicular adenopathy, Normal to palpation without dominant masses Heart: regular rate and rhythm Abdomen: soft, non-tender; bowel  sounds normal; no masses,  no organomegaly Extremities: extremities normal, atraumatic, no cyanosis or edema Skin: Skin color, texture, turgor normal. No rashes or lesions Lymph nodes: Cervical, supraclavicular, and axillary nodes normal. No abnormal inguinal nodes palpated Neurologic: Grossly normal   Pelvic: External genitalia:  no lesions              Urethra:  normal appearing urethra with no masses, tenderness or lesions              Bartholins and Skenes: normal                 Vagina: normal appearing vagina with normal color and no discharge, no lesions              Cervix: no cervical motion tenderness and no lesions              Pap taken: Yes.   Bimanual Exam:  Uterus:  normal size, contour, position,  consistency, mobility, non-tender              Adnexa: no mass, fullness, tenderness               Rectovaginal: Confirms               Anus:  normal sphincter tone, no lesions  Chaperone,  CMA, was present for exam.  Assessment/Plan:  1. Encounter for annual routine gynecological examination - Continue annual screening mammograms and self breast awareness  2. Cervical cancer screening (Primary) - Cytology - PAP( [facility])   RTO 1 year for annual gyn exam. Yolanda Hence

## 2023-09-23 LAB — CYTOLOGY - PAP
Comment: NEGATIVE
Diagnosis: NEGATIVE
High risk HPV: NEGATIVE

## 2023-09-30 ENCOUNTER — Ambulatory Visit (HOSPITAL_BASED_OUTPATIENT_CLINIC_OR_DEPARTMENT_OTHER): Payer: Self-pay | Admitting: Certified Nurse Midwife

## 2023-10-08 DIAGNOSIS — R319 Hematuria, unspecified: Secondary | ICD-10-CM | POA: Diagnosis not present

## 2023-10-08 DIAGNOSIS — R809 Proteinuria, unspecified: Secondary | ICD-10-CM | POA: Diagnosis not present

## 2023-10-08 DIAGNOSIS — I10 Essential (primary) hypertension: Secondary | ICD-10-CM | POA: Diagnosis not present

## 2023-10-08 DIAGNOSIS — N179 Acute kidney failure, unspecified: Secondary | ICD-10-CM | POA: Diagnosis not present

## 2023-10-08 DIAGNOSIS — N02B9 Other recurrent and persistent immunoglobulin A nephropathy: Secondary | ICD-10-CM | POA: Diagnosis not present

## 2023-10-09 LAB — LAB REPORT - SCANNED
Albumin, Urine POC: 1064.4
Creatinine, POC: 40.5 mg/dL
EGFR: 30
Microalb Creat Ratio: 2628

## 2023-10-20 DIAGNOSIS — I10 Essential (primary) hypertension: Secondary | ICD-10-CM | POA: Diagnosis not present

## 2023-11-23 ENCOUNTER — Emergency Department (HOSPITAL_BASED_OUTPATIENT_CLINIC_OR_DEPARTMENT_OTHER)

## 2023-11-23 ENCOUNTER — Encounter (HOSPITAL_BASED_OUTPATIENT_CLINIC_OR_DEPARTMENT_OTHER): Payer: Self-pay

## 2023-11-23 ENCOUNTER — Other Ambulatory Visit: Payer: Self-pay

## 2023-11-23 ENCOUNTER — Emergency Department (HOSPITAL_BASED_OUTPATIENT_CLINIC_OR_DEPARTMENT_OTHER)
Admission: EM | Admit: 2023-11-23 | Discharge: 2023-11-23 | Disposition: A | Discharge status: Home / Self Care | Attending: Emergency Medicine | Admitting: Emergency Medicine

## 2023-11-23 DIAGNOSIS — K449 Diaphragmatic hernia without obstruction or gangrene: Secondary | ICD-10-CM | POA: Diagnosis not present

## 2023-11-23 DIAGNOSIS — D72829 Elevated white blood cell count, unspecified: Secondary | ICD-10-CM | POA: Insufficient documentation

## 2023-11-23 DIAGNOSIS — R1031 Right lower quadrant pain: Secondary | ICD-10-CM | POA: Diagnosis not present

## 2023-11-23 DIAGNOSIS — R1909 Other intra-abdominal and pelvic swelling, mass and lump: Secondary | ICD-10-CM | POA: Insufficient documentation

## 2023-11-23 DIAGNOSIS — N179 Acute kidney failure, unspecified: Secondary | ICD-10-CM | POA: Insufficient documentation

## 2023-11-23 DIAGNOSIS — N9489 Other specified conditions associated with female genital organs and menstrual cycle: Secondary | ICD-10-CM

## 2023-11-23 DIAGNOSIS — K8020 Calculus of gallbladder without cholecystitis without obstruction: Secondary | ICD-10-CM | POA: Diagnosis not present

## 2023-11-23 DIAGNOSIS — R7989 Other specified abnormal findings of blood chemistry: Secondary | ICD-10-CM | POA: Diagnosis not present

## 2023-11-23 DIAGNOSIS — N838 Other noninflammatory disorders of ovary, fallopian tube and broad ligament: Secondary | ICD-10-CM | POA: Diagnosis not present

## 2023-11-23 DIAGNOSIS — R112 Nausea with vomiting, unspecified: Secondary | ICD-10-CM | POA: Diagnosis not present

## 2023-11-23 LAB — URINALYSIS, ROUTINE W REFLEX MICROSCOPIC
Bilirubin Urine: NEGATIVE
Glucose, UA: NEGATIVE mg/dL
Ketones, ur: NEGATIVE mg/dL
Leukocytes,Ua: NEGATIVE
Nitrite: NEGATIVE
Protein, ur: >=300 mg/dL — AB
Specific Gravity, Urine: 1.02 (ref 1.005–1.030)
pH: 7 (ref 5.0–8.0)

## 2023-11-23 LAB — COMPREHENSIVE METABOLIC PANEL WITH GFR
ALT: 11 U/L (ref 0–44)
AST: 14 U/L — ABNORMAL LOW (ref 15–41)
Albumin: 4.2 g/dL (ref 3.5–5.0)
Alkaline Phosphatase: 45 U/L (ref 38–126)
Anion gap: 15 (ref 5–15)
BUN: 28 mg/dL — ABNORMAL HIGH (ref 6–20)
CO2: 24 mmol/L (ref 22–32)
Calcium: 9.7 mg/dL (ref 8.9–10.3)
Chloride: 99 mmol/L (ref 98–111)
Creatinine, Ser: 2.16 mg/dL — ABNORMAL HIGH (ref 0.44–1.00)
GFR, Estimated: 28 mL/min — ABNORMAL LOW (ref >60)
Glucose, Bld: 140 mg/dL — ABNORMAL HIGH (ref 70–99)
Potassium: 4.2 mmol/L (ref 3.5–5.1)
Sodium: 137 mmol/L (ref 135–145)
Total Bilirubin: 0.4 mg/dL (ref 0.0–1.2)
Total Protein: 7 g/dL (ref 6.5–8.1)

## 2023-11-23 LAB — CBC
HCT: 38.1 % (ref 36.0–46.0)
Hemoglobin: 12.7 g/dL (ref 12.0–15.0)
MCH: 27.6 pg (ref 26.0–34.0)
MCHC: 33.3 g/dL (ref 30.0–36.0)
MCV: 82.8 fL (ref 80.0–100.0)
Platelets: 464 K/uL — ABNORMAL HIGH (ref 150–400)
RBC: 4.6 MIL/uL (ref 3.87–5.11)
RDW: 12.9 % (ref 11.5–15.5)
WBC: 22 K/uL — ABNORMAL HIGH (ref 4.0–10.5)
nRBC: 0 % (ref 0.0–0.2)

## 2023-11-23 LAB — LIPASE, BLOOD: Lipase: 35 U/L (ref 11–51)

## 2023-11-23 LAB — URINALYSIS, MICROSCOPIC (REFLEX): RBC / HPF: >50 RBC/hpf (ref 0–5)

## 2023-11-23 LAB — HCG, SERUM, QUALITATIVE: Preg, Serum: NEGATIVE

## 2023-11-23 MED ORDER — SODIUM CHLORIDE 0.9 % IV BOLUS
1000.0000 mL | Freq: Once | INTRAVENOUS | Status: AC
  Administered 2023-11-23: 1000 mL via INTRAVENOUS

## 2023-11-23 MED ORDER — HYDROMORPHONE HCL 1 MG/ML IJ SOLN
1.0000 mg | Freq: Once | INTRAMUSCULAR | Status: AC
  Administered 2023-11-23: 1 mg via INTRAVENOUS
  Filled 2023-11-23: qty 1

## 2023-11-23 MED ORDER — ONDANSETRON HCL 4 MG/2ML IJ SOLN
4.0000 mg | Freq: Once | INTRAMUSCULAR | Status: AC
  Administered 2023-11-23: 4 mg via INTRAVENOUS
  Filled 2023-11-23: qty 2

## 2023-11-23 MED ORDER — HYDROCODONE-ACETAMINOPHEN 5-325 MG PO TABS: 1.0000 | ORAL_TABLET | Freq: Four times a day (QID) | ORAL | 0 refills | Status: DC | PRN

## 2023-11-23 MED ORDER — HYDROMORPHONE HCL 1 MG/ML IJ SOLN
0.5000 mg | Freq: Once | INTRAMUSCULAR | Status: AC
  Administered 2023-11-23: 0.5 mg via INTRAVENOUS
  Filled 2023-11-23: qty 1

## 2023-11-23 MED ORDER — KETOROLAC TROMETHAMINE 15 MG/ML IJ SOLN
15.0000 mg | Freq: Once | INTRAMUSCULAR | Status: AC
  Administered 2023-11-23: 15 mg via INTRAVENOUS
  Filled 2023-11-23: qty 1

## 2023-11-23 NOTE — ED Provider Notes (Signed)
 [facility] Provider Note   CSN: [id number] Arrival date & time: 11/23/23  1059     Patient presents with: Abdominal Pain   Tanya Perkins is a 43 y.o. female.  {Add pertinent medical, surgical, social history, OB history to HPI:32947} 43 yo F with a chief complaint of sudden onset right lower quadrant abdominal discomfort.  This started a couple hours ago.  Sharp severe nothing seems to make it better or worse.  Has had nausea and vomiting when she got up and tried ambulating.  Denies urinary symptoms denies change to her bowel regimen.  Denies fevers or chills.   Abdominal Pain      Prior to Admission medications   Medication Sig Start Date End Date Taking? Authorizing Provider  acetaminophen  (TYLENOL ) 500 MG tablet Take 500-1,000 mg by mouth every 6 (six) hours as needed for moderate pain (pain score 4-6).    [provider]  amLODipine  (NORVASC ) 10 MG tablet Take 1 tablet (10 mg total) by mouth daily. 07/02/23   Mdala-Gausi, Golden Pillow, MD  carvedilol  (COREG ) 6.25 MG tablet Take 6.25 mg by mouth 2 (two) times daily with a meal.    [provider]  Multiple Vitamins-Minerals (MULTIVITAMIN ADULTS PO) Take 1 tablet by mouth daily.    [provider]    Allergies: Patient has no known allergies.    Review of Systems  Gastrointestinal:  Positive for abdominal pain.    Updated Vital Signs BP (!) 186/96   Pulse 82   Temp 98 F (36.7 C) (Oral)   Resp 18   Ht 5' 3 (1.6 m)   Wt 72.6 kg   LMP 11/01/2023 (Approximate)   SpO2 100%   BMI 28.34 kg/m   Physical Exam Vitals and nursing note reviewed.  Constitutional:      General: She is not in acute distress.    Appearance: She is well-developed. She is not diaphoretic.  HENT:     Head: Normocephalic and atraumatic.  Eyes:     Pupils: Pupils are equal, round, and reactive to light.  Cardiovascular:     Rate and Rhythm: Normal rate and  regular rhythm.     Heart sounds: No murmur heard.    No friction rub. No gallop.  Pulmonary:     Effort: Pulmonary effort is normal.     Breath sounds: No wheezing or rales.  Abdominal:     General: There is no distension.     Palpations: Abdomen is soft.     Tenderness: There is abdominal tenderness.     Comments: Patient has her hand held over her right lower quadrant.  Does have discomfort with palpation there.  Musculoskeletal:        General: No tenderness.     Cervical back: Normal range of motion and neck supple.  Skin:    General: Skin is warm and dry.  Neurological:     Mental Status: She is alert and oriented to person, place, and time.  Psychiatric:        Behavior: Behavior normal.     (all labs ordered are listed, but only abnormal results are displayed) Labs Reviewed  CBC - Abnormal; Notable for the following components:      Result Value   WBC 22.0 (*)    Platelets 464 (*)    All other components within normal limits  LIPASE, BLOOD  COMPREHENSIVE METABOLIC PANEL WITH GFR  URINALYSIS, ROUTINE W REFLEX MICROSCOPIC  PREGNANCY,  URINE  HCG, SERUM, QUALITATIVE    EKG: None  Radiology: No results found.  {Document cardiac monitor, telemetry assessment procedure when appropriate:32947} Procedures   Medications Ordered in the ED  sodium chloride  0.9 % bolus 1,000 mL (1,000 mLs Intravenous New Bag/Given 11/23/23 1121)  HYDROmorphone  (DILAUDID ) injection 1 mg (1 mg Intravenous Given 11/23/23 1118)  ondansetron  (ZOFRAN ) injection 4 mg (4 mg Intravenous Given 11/23/23 1118)      {Click here for ABCD2, HEART and other calculators REFRESH Note before signing:1}                              Medical Decision Making Amount and/or Complexity of Data Reviewed Labs: ordered. Radiology: ordered.  Risk Prescription drug management.   43 yo F with a chief complaints of sudden onset right lower quadrant abdominal discomfort.  Going on for about 2 hours now.  Will  obtain a laboratory evaluation.  CT imaging.  CT is concerning for a right-sided mass.  No obvious other acute intra-abdominal or pelvic findings.  Patient does have an AKI here.  No significant electrolyte abnormalities.  LFTs and lipase are unremarkable.  Leukocytosis of 22.  Patient was reassessed and feeling a bit better.  Will obtain a pelvic ultrasound to assess for possible torsion.  Ultrasound has resulted without torsion.  Mass versus fibroid.  Patient still unable to provide a urine sample.  She did apparently urinate for her ultrasound but did not provide a sample for the lab.  Will give another bag of IV fluids.  {Document critical care time when appropriate  Document review of labs and clinical decision tools ie CHADS2VASC2, etc  Document your independent review of radiology images and any outside records  Document your discussion with family members, caretakers and with consultants  Document social determinants of health affecting pt's care  Document your decision making why or why not admission, treatments were needed:32947:::1}   Final diagnoses:  None    ED Discharge Orders     None

## 2023-11-23 NOTE — ED Triage Notes (Signed)
 C/o RLQ abdominal pain and nausea/vomiting, chills x 2 hours. Pain worse when stepping.

## 2023-11-23 NOTE — Discharge Instructions (Addendum)
 Please follow-up with your primary care provider for a recheck of your kidney function outpatient, continue to drink plenty of fluids.  Follow-up with your OB/GYN tomorrow for continued management of your right-sided adnexal mass.  Norco has been provided for breakthrough pain control, advised Tylenol  as well, avoid NSAIDs given your kidney injury.

## 2023-11-23 NOTE — ED Provider Notes (Signed)
  Physical Exam  BP (!) 186/96   Pulse (!) 51   Temp 97.8 F (36.6 C) (Oral)   Resp 18   Ht 5' 3 (1.6 m)   Wt 72.6 kg   LMP 11/01/2023 (Approximate)   SpO2 100%   BMI 28.34 kg/m   Physical Exam  Procedures  Procedures  ED Course / MDM    Medical Decision Making Amount and/or Complexity of Data Reviewed Labs: ordered. Radiology: ordered.  Risk Prescription drug management.     43 year old female presenting to the emergency department right lower quadrant abdominal pain, nausea and vomiting and chills for the past 2 hours.  Urinalysis pending.  Imaging showing new mass.  Patient has a bump in her serum creatinine from her baseline.  Plan to reassess the patient's pain following UA results.     CT Stone: IMPRESSION:  1. 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 Recommend  gynecology follow-up and further evaluation with pelvic MRI without  and with contrast.  2. No evidence of urinary tract calculus or hydronephrosis.  3. No other acute findings identified.  The appendix appears normal.  4. Cholelithiasis without evidence of cholecystitis or biliary  ductal dilatation.   Pelvic US : IMPRESSION:  Dominant right pelvic solid mass may arise exophytic off the right  ovary or uterus and could represent an ovarian neoplasm or fibroid.  Recommend nonemergent pre and post-contrast pelvic MRI to help  differentiate and further characterize.    Trace pelvic fluid is most likely physiologic.    Labs: Leukocytosis to 22, Cr 2.16, BUN 28. UA unconvincing for UTI. Pt pain improved on repeat assessment, tolerating PO, has OBGYN follow-up tomorrow. Norco provided for breakthrough pain, advised Tylenol , avoidance of NSAIDs in the setting of mild elevation in creatinine, advise recheck of creatinine with her PCP as well.  Patient stable for discharge.   Jerrol Agent, MD 11/23/23 1723

## 2023-11-24 ENCOUNTER — Ambulatory Visit (HOSPITAL_BASED_OUTPATIENT_CLINIC_OR_DEPARTMENT_OTHER): Admitting: Obstetrics & Gynecology

## 2023-11-24 ENCOUNTER — Encounter (HOSPITAL_BASED_OUTPATIENT_CLINIC_OR_DEPARTMENT_OTHER): Payer: Self-pay | Admitting: Obstetrics & Gynecology

## 2023-11-24 VITALS — BP 135/86 | HR 67 | Wt 170.0 lb

## 2023-11-24 DIAGNOSIS — R7989 Other specified abnormal findings of blood chemistry: Secondary | ICD-10-CM

## 2023-11-24 DIAGNOSIS — R1900 Intra-abdominal and pelvic swelling, mass and lump, unspecified site: Secondary | ICD-10-CM

## 2023-11-24 DIAGNOSIS — D72829 Elevated white blood cell count, unspecified: Secondary | ICD-10-CM

## 2023-11-24 DIAGNOSIS — R1031 Right lower quadrant pain: Secondary | ICD-10-CM

## 2023-11-24 DIAGNOSIS — I10 Essential (primary) hypertension: Secondary | ICD-10-CM | POA: Diagnosis not present

## 2023-11-24 DIAGNOSIS — R1909 Other intra-abdominal and pelvic swelling, mass and lump: Secondary | ICD-10-CM | POA: Diagnosis not present

## 2023-11-24 NOTE — Progress Notes (Unsigned)
 CC:   ER follow up  HPI: 43 y.o. G1P1001 Single White or Caucasian female here for follow-up after being seen in the emergency room on July 21.  She was having abdominal and flank pain particularly on the right side.  This was associated with nausea and vomiting for about 2 hours.  Last CT of the abdomen pelvis with stone protocol was done due to concerns for nephrolithiasis.  CT showed an adnexal mass measuring 7.8 x 7.1 x 13cm this was large from prior imaging done in 2023 of the adnexa measured 5 x 3.9 x 6.4 cm.  This did have a broad-based connection with the uterus and 1 set of images and could be a fibroid but this was not definitive on CT scan.  There is no free fluid or pelvic lymph nodes that were noted.  She did have gallstones as well.  Pelvic ultrasound was performed as well emergency room with the ovary measuring 5.8 x 6.8 x 6.0 cm.  They did identify some increased vascularity.  This imaging was also not diagnostic for whether this was a fibroid or an adnexal mass.  I did personally review both of these studies.  Additionally she had a white count of 22,000 with platelet count of 464.  Creatinine was 2.1.  Liver enzymes were normal.  Lipase was normal.  Urine showed blood and protein.  Patient has seen Dr. Nikki in the past.  She had a CT done March 2023 showing an adnexal mass measuring about 5 x 4 x 4.3 cm.  MRI was recommended at that time but the patient did not have this done.  She does have a history of hypertensive crisis in February of this year.  Her blood pressure at her primary care provider was 214/150.  She was sent to the ER and admitted for three days.  Was not on anti-hypertensive therapy but was then started on medication.  Her creatinine was elevated to 3.4 with a GFR of 35.  Her protein to creatinine ratio was elevated.  It was felt this acute kidney injury was due to the elevated blood pressures.  Her creatinine has gradually improved but is still  elevated.  Patient denies any pain today.  She has no fever.  She feels much better than when she went to the ER.  We discussed doing a CA125 today and proceeding with MRI.  Given her pain and her abnormal blood work, I do think she is going to need surgical intervention but it would be useful to know if this was definitively adnexal or not.  Patient's mother is with her today and is comfortable with this plan.  Questions answered.  Last Pap was 09/21/2023 and was negative with negative high-risk HPV.  This was done by Arland Roller, CNM.   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     amLODipine  (NORVASC ) 10 MG tablet Take 1 tablet (10 mg total) by mouth daily. 30 tablet 2   carvedilol  (COREG ) 6.25 MG tablet Take 6.25 mg by mouth 2 (two) times daily with a meal.  HYDROcodone -acetaminophen  (NORCO/VICODIN) 5-325 MG tablet Take 1-2 tablets by mouth every 6 (six) hours as needed. 10 tablet 0   losartan (COZAAR) 50 MG tablet Take 50 mg by mouth at bedtime.     Multiple Vitamins-Minerals (MULTIVITAMIN ADULTS PO) Take 1 tablet by mouth daily.     No current facility-administered medications on file prior to visit.    ALLERGIES: Patient has no known allergies.  SH: Single, non-smoker  Review of Systems  Constitutional: Negative.   Genitourinary: Negative.     PHYSICAL EXAMINATION:    BP 135/86 (BP Location: Right Arm, Patient Position: Sitting, Cuff Size: Large)   Pulse 67   Wt 170 lb (77.1 kg)   LMP 11/01/2023 (Approximate)   SpO2 100%   BMI 30.11 kg/m     General appearance: alert, cooperative and appears stated age Abdomen: Soft,  mildly tender abdominal mass that is palpable Umbilicus but to the right, no hernias, no organomegaly Lymph:  no inguinal LAD noted  Pelvic: External genitalia:  no lesions              Urethra:  normal appearing urethra with no masses, tenderness or lesions              Bartholins and Skenes: normal                 Vagina: normal mucosa without prolapse or lesions              Cervix: no lesions              Bimanual Exam:  Uterus: Does not feel enlarged but there is a mass that extends to the right mid abdomen that is mildly tender to palpation         Chaperone was present for exam.  Assessment/Plan: 1. Pelvic mass (Primary) - Given palpable mass and nondiagnostic CT and ultrasound, pelvic MRI is recommended.  Orders placed.  I have also suggested proceeding with CA125 today.  She is comfortable with this plan. - MR PELVIS W WO CONTRAST; Future - CA 125  2. Primary hypertension -Patient is currently on 3 medications for blood pressure management, amlodipine , carvedilol , losartan.  3. Elevated serum creatinine -Most recent level was 2.1.  4. Right lower quadrant abdominal pain - improved since ER visit.  Not taking anything for pain.  5. Leukocytosis, unspecified type - she may have experienced some intermittent torsion of the mass causing this elevation.  Will need to be repeated but will await Ca-125 and MRI results.

## 2023-11-25 LAB — CA 125: Cancer Antigen (CA) 125: 84.9 U/mL — ABNORMAL HIGH (ref 0.0–38.1)

## 2023-11-26 ENCOUNTER — Encounter (HOSPITAL_BASED_OUTPATIENT_CLINIC_OR_DEPARTMENT_OTHER): Payer: Self-pay | Admitting: Obstetrics & Gynecology

## 2023-11-26 ENCOUNTER — Ambulatory Visit (HOSPITAL_BASED_OUTPATIENT_CLINIC_OR_DEPARTMENT_OTHER): Payer: Self-pay | Admitting: Obstetrics & Gynecology

## 2023-11-26 DIAGNOSIS — N9489 Other specified conditions associated with female genital organs and menstrual cycle: Secondary | ICD-10-CM

## 2023-11-26 DIAGNOSIS — R971 Elevated cancer antigen 125 [CA 125]: Secondary | ICD-10-CM

## 2023-11-29 ENCOUNTER — Ambulatory Visit (HOSPITAL_BASED_OUTPATIENT_CLINIC_OR_DEPARTMENT_OTHER)
Admission: RE | Admit: 2023-11-29 | Discharge: 2023-11-29 | Disposition: A | Discharge status: Home / Self Care | Source: Ambulatory Visit | Attending: Obstetrics & Gynecology | Admitting: Obstetrics & Gynecology

## 2023-11-29 DIAGNOSIS — N8003 Adenomyosis of the uterus: Secondary | ICD-10-CM | POA: Diagnosis not present

## 2023-11-29 DIAGNOSIS — R1900 Intra-abdominal and pelvic swelling, mass and lump, unspecified site: Secondary | ICD-10-CM | POA: Diagnosis not present

## 2023-11-29 DIAGNOSIS — D259 Leiomyoma of uterus, unspecified: Secondary | ICD-10-CM | POA: Diagnosis not present

## 2023-11-29 DIAGNOSIS — N888 Other specified noninflammatory disorders of cervix uteri: Secondary | ICD-10-CM | POA: Diagnosis not present

## 2023-11-29 MED ORDER — GADOBUTROL 1 MMOL/ML IV SOLN
7.5000 mL | Freq: Once | INTRAVENOUS | Status: AC | PRN
  Administered 2023-11-29: 7.5 mL via INTRAVENOUS

## 2023-12-01 ENCOUNTER — Encounter (HOSPITAL_COMMUNITY): Payer: Self-pay

## 2023-12-02 DIAGNOSIS — D631 Anemia in chronic kidney disease: Secondary | ICD-10-CM | POA: Diagnosis not present

## 2023-12-02 DIAGNOSIS — N02B9 Other recurrent and persistent immunoglobulin A nephropathy: Secondary | ICD-10-CM | POA: Diagnosis not present

## 2023-12-02 DIAGNOSIS — I10 Essential (primary) hypertension: Secondary | ICD-10-CM | POA: Diagnosis not present

## 2023-12-02 DIAGNOSIS — N1832 Chronic kidney disease, stage 3b: Secondary | ICD-10-CM | POA: Diagnosis not present

## 2023-12-15 ENCOUNTER — Telehealth: Payer: Self-pay | Admitting: *Deleted

## 2023-12-15 NOTE — Telephone Encounter (Signed)
 [name] is returning Tanya Perkins's call.She declined appointment tomorrow with Dr.Tucker.  Aware we will look at the schedule and call her back.

## 2023-12-15 NOTE — Telephone Encounter (Signed)
 LMOM for the patient to call the office back. Patient to be scheduled for a new patient appt for tomorrow.

## 2023-12-16 NOTE — Telephone Encounter (Signed)
 Spoke with the patient regarding the referral to GYN oncology. Patient scheduled as new patient with Dr Viktoria on 8/28 at 9:45 am.  Patient given an arrival time of 9:15 am.   Explained to the patient the the doctor will perform a pelvic exam at this visit. Patient given the policy that only one visitor allowed and that visitor must be over 16 yrs are allowed in the Cancer Center. Patient given the address/phone number for the clinic and that the center offers free valet service. Patient aware that masks required.

## 2023-12-22 ENCOUNTER — Telehealth: Payer: Self-pay | Admitting: *Deleted

## 2023-12-22 NOTE — Telephone Encounter (Signed)
 Spoke with the patient and moved appt on 8/28 from 9:45 am to 11:15 am. Patient aware

## 2023-12-29 ENCOUNTER — Encounter: Payer: Self-pay | Admitting: Gynecologic Oncology

## 2023-12-31 ENCOUNTER — Inpatient Hospital Stay

## 2023-12-31 ENCOUNTER — Telehealth: Payer: Self-pay | Admitting: *Deleted

## 2023-12-31 ENCOUNTER — Inpatient Hospital Stay: Admitting: Oncology

## 2023-12-31 ENCOUNTER — Inpatient Hospital Stay: Attending: Gynecologic Oncology | Admitting: Gynecologic Oncology

## 2023-12-31 ENCOUNTER — Encounter: Payer: Self-pay | Admitting: Gynecologic Oncology

## 2023-12-31 VITALS — BP 147/82 | HR 66 | Temp 98.1°F | Resp 19 | Ht 63.0 in | Wt 173.2 lb

## 2023-12-31 DIAGNOSIS — E039 Hypothyroidism, unspecified: Secondary | ICD-10-CM | POA: Diagnosis not present

## 2023-12-31 DIAGNOSIS — R102 Pelvic and perineal pain: Secondary | ICD-10-CM | POA: Insufficient documentation

## 2023-12-31 DIAGNOSIS — N8003 Adenomyosis of the uterus: Secondary | ICD-10-CM | POA: Insufficient documentation

## 2023-12-31 DIAGNOSIS — Z801 Family history of malignant neoplasm of trachea, bronchus and lung: Secondary | ICD-10-CM | POA: Diagnosis not present

## 2023-12-31 DIAGNOSIS — Z7984 Long term (current) use of oral hypoglycemic drugs: Secondary | ICD-10-CM | POA: Diagnosis not present

## 2023-12-31 DIAGNOSIS — N1832 Chronic kidney disease, stage 3b: Secondary | ICD-10-CM | POA: Insufficient documentation

## 2023-12-31 DIAGNOSIS — Z803 Family history of malignant neoplasm of breast: Secondary | ICD-10-CM | POA: Diagnosis not present

## 2023-12-31 DIAGNOSIS — I129 Hypertensive chronic kidney disease with stage 1 through stage 4 chronic kidney disease, or unspecified chronic kidney disease: Secondary | ICD-10-CM | POA: Diagnosis not present

## 2023-12-31 DIAGNOSIS — N9489 Other specified conditions associated with female genital organs and menstrual cycle: Secondary | ICD-10-CM | POA: Insufficient documentation

## 2023-12-31 DIAGNOSIS — D259 Leiomyoma of uterus, unspecified: Secondary | ICD-10-CM | POA: Diagnosis not present

## 2023-12-31 DIAGNOSIS — I10 Essential (primary) hypertension: Secondary | ICD-10-CM | POA: Insufficient documentation

## 2023-12-31 DIAGNOSIS — Z79899 Other long term (current) drug therapy: Secondary | ICD-10-CM | POA: Insufficient documentation

## 2023-12-31 DIAGNOSIS — D3911 Neoplasm of uncertain behavior of right ovary: Secondary | ICD-10-CM | POA: Diagnosis not present

## 2023-12-31 DIAGNOSIS — Z806 Family history of leukemia: Secondary | ICD-10-CM | POA: Diagnosis not present

## 2023-12-31 LAB — CBC (CANCER CENTER ONLY)
HCT: 36.1 % (ref 36.0–46.0)
Hemoglobin: 11.8 g/dL — ABNORMAL LOW (ref 12.0–15.0)
MCH: 27.5 pg (ref 26.0–34.0)
MCHC: 32.7 g/dL (ref 30.0–36.0)
MCV: 84.1 fL (ref 80.0–100.0)
Platelet Count: 383 K/uL (ref 150–400)
RBC: 4.29 MIL/uL (ref 3.87–5.11)
RDW: 13.6 % (ref 11.5–15.5)
WBC Count: 13.6 K/uL — ABNORMAL HIGH (ref 4.0–10.5)
nRBC: 0 % (ref 0.0–0.2)

## 2023-12-31 LAB — COMPREHENSIVE METABOLIC PANEL WITH GFR
ALT: 11 U/L (ref 0–44)
AST: 10 U/L — ABNORMAL LOW (ref 15–41)
Albumin: 3.6 g/dL (ref 3.5–5.0)
Alkaline Phosphatase: 47 U/L (ref 38–126)
Anion gap: 6 (ref 5–15)
BUN: 33 mg/dL — ABNORMAL HIGH (ref 6–20)
CO2: 30 mmol/L (ref 22–32)
Calcium: 9.2 mg/dL (ref 8.9–10.3)
Chloride: 104 mmol/L (ref 98–111)
Creatinine, Ser: 2.22 mg/dL — ABNORMAL HIGH (ref 0.44–1.00)
GFR, Estimated: 27 mL/min — ABNORMAL LOW (ref >60)
Glucose, Bld: 89 mg/dL (ref 70–99)
Potassium: 3.8 mmol/L (ref 3.5–5.1)
Sodium: 140 mmol/L (ref 135–145)
Total Bilirubin: 0.3 mg/dL (ref 0.0–1.2)
Total Protein: 6.4 g/dL — ABNORMAL LOW (ref 6.5–8.1)

## 2023-12-31 MED ORDER — HYDROCODONE-ACETAMINOPHEN 5-325 MG PO TABS: 1.0000 | ORAL_TABLET | Freq: Three times a day (TID) | ORAL | 0 refills | Status: DC | PRN

## 2023-12-31 MED ORDER — SENNOSIDES-DOCUSATE SODIUM 8.6-50 MG PO TABS: 1.0000 | ORAL_TABLET | Freq: Every day | ORAL | 0 refills | Status: DC

## 2023-12-31 NOTE — H&P (View-Only) (Signed)
 GYNECOLOGIC ONCOLOGY NEW PATIENT CONSULTATION   Patient Name: Tanya Perkins  Patient Age: 43 y.o. Date of Service: 12/31/23 Referring Provider: Ronal Pinal, MD  Primary Care Provider: Allwardt, Mardy HERO, PA-C Consulting Provider: Comer Dollar, MD   Assessment/Plan:  Premenopausal patient with enlarging adnexal mass, suspected solid ovarian mass versus broad ligament fibroid.  Discussed recent imaging and symptoms with the patient and her mother, who accompanied her today.  We looked at CT images together.  Based on size and appearance of the mass, I suspect that this may be a solid ovarian mass.  Her symptoms are somewhat suggestive of ovarian torsion with severe pain that comes and goes for brief periods of time.  Although my suspicion for a malignant process is low, in the setting of increasing size of this mass as well as her significant symptomatology, I recommend that we proceed with surgery for therapeutic and diagnostic purposes.  Discussed possibility that this may be an adnexal mass versus fibroid.  If at the time of surgery, it appears to be an adnexal mass, plan will be to remove the right adnexa and send this for frozen section to help determine need for additional procedures.  We would plan for at least bilateral salpingectomy.  I have asked the patient to think about whether she would like concurrent hysterectomy at the time of surgery if the mass is separate from the uterus.  If this is a pedunculated or parasitic fibroid, we discussed plan for total hysterectomy with bilateral salpingectomy.  I reviewed that 1 or both ovaries could be left in situ if they are normal at the time of surgery and frozen section is benign.  We reviewed the risks and benefits of removal of bilateral ovaries at the time of surgery in terms of surgical menopause, need for hormone replacement therapy, and possible need for additional surgery in the future.  Ultimately, the patient would like to  think about whether she would want to keep 1 or both ovaries if normal in appearance.  Given her significant kidney disease, my office will reach out for preoperative clearance from her primary care provider and nephrologist.  We discussed her prior myomectomy surgery.  Given posterior location of her fibroid, we discussed that there may be some scar tissue related to this prior surgery.  I reviewed the risk that if the colon is adherent to the posterior uterus, this can increase the likelihood of damage to the colon with need for repair.  We reviewed the plan for a robotic assisted mass excision, bilateral salpingectomy, possible total hysterectomy, possible unilateral versus bilateral oophorectomy, possible staging, possible laparotomy. The risks of surgery were discussed in detail and she understands these to include infection; wound separation; hernia; vaginal cuff separation, injury to adjacent organs such as bowel, bladder, blood vessels, ureters and nerves; bleeding which may require blood transfusion; anesthesia risk; thromboembolic events; possible death; unforeseen complications; possible need for re-exploration; medical complications such as heart attack, stroke, pleural effusion and pneumonia. The patient will receive DVT and antibiotic prophylaxis as indicated. She voiced a clear understanding. She had the opportunity to ask questions. Perioperative instructions were reviewed with her. Prescriptions for post-op medications were sent to her pharmacy of choice.  A copy of this note was sent to the patient's referring provider.   70 minutes of total time was spent for this patient encounter, including preparation, face-to-face counseling with the patient and coordination of care, and documentation of the encounter.  Comer Dollar, MD  Division of  Gynecologic Oncology  Department of Obstetrics and Gynecology  [facility]   ___________________________________________  Chief Complaint: Chief Complaint  Patient presents with   Adnexal mass    History of Present Illness:  Tanya Perkins is a 43 y.o. y.o. female who is seen in consultation at the request of Dr. Cleotilde for an evaluation of asymptomatic adnexal mass.  The patient was seen in the emergency department in mid July with right abdominal and flank pain with associated nausea and emesis.  CT of the abdomen and pelvis showed a 7.8 x 13 cm right adnexal mass, mostly solid in appearance.  This had increased in size since prior imaging in 2023 where there was an adnexal mass measuring up to 6.4 cm.  No free fluid, adenopathy, or other findings concerning for metastatic disease noted.  Patient was noted to have a white blood cell count of 22,000.  Creatinine was 2.1 at that time.  Pelvic MRI was performed on 7/27 and showed uterine adenomyosis with multiple small fibroids.  A large, T1 and T2 indeterminant, homogenously contrast-enhancing mass noted to be inseparable from the right ovary but clearly distinct from the uterine fundus measures 8.5 x 6.4 x 7.7 meters.  Differential includes ovarian solid tumor such as a fibrothecoma or a broad ligament fibroid.  No evidence of metastatic disease.   11/24/23: CA-125 84.9.  The patient comes in with her mother today.  Notes overall doing well.  Has some mild pain almost daily, sometimes feels something moving in her pelvis when she walks.  She will have more significant pain every 2-3 days which can last anywhere from 10 minutes to several hours.  She describes this as feeling as if someone is sticking a knife into her.  Sometimes she will have radiation of the pain down her leg.  She uses Tylenol  and heat therapy which helped provide some relief.  Has opted not to use narcotics that she was given at her emergency department visit.  Other than the initial episode where she had some associated nausea and emesis, she  denies any further nausea or episodes of emesis.  She denies any vaginal bleeding.  She reports baseline bowel and bladder function.  She initially lost about 30 pounds with her new kidney diagnosis earlier this year and changed her diet; reports weight has been stable subsequently.   The patient presented with hypertensive urgency in February of this year at which time her blood pressure was over 200 systolic at her primary care provider's office.  She was admitted for 3 days and ultimately discharged on antihypertensive medication.  Her creatinine was quite elevated.  She follows with nephrology now.  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  She has been checking her blood pressures at home and they run on average 130s/90s.  She has a history of surgery for her large uterine fibroid in 2009.  At that time, she underwent exploratory laparotomy, myomectomy, and chromopertubation.  Findings included a single large myoma on the posterior uterine surface, otherwise normal pelvic anatomy.  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  PAST MEDICAL HISTORY:  Past Medical History:  Diagnosis Date   Fibroid    uterine   History of chicken pox    Hypertension    Thyroid  disease    Thyroid  mass 2011   ONCOCYTIC ADENOMA--NON MALIGNANT     PAST SURGICAL HISTORY:  Past Surgical History:  Procedure Laterality Date   MYOMECTOMY ABDOMINAL APPROACH  05/2007  THYROID  LOBECTOMY     LEFT - benign tumor   Tooth #20 implant      OB/GYN HISTORY:  OB History  Gravida Para Term Preterm AB Living  1 1 1   1   SAB IAB Ectopic Multiple Live Births      1    # Outcome Date GA Lbr Len/2nd Weight Sex Type Anes PTL Lv  1 Term 2007   7 lb (3.175 kg) M Vag-Spont   LIV    No LMP recorded.  Age at menarche: 54  Regular menses Hx of STDs: denies Last pap: 09/2023 - NILM, HR HPV negative History of abnormal pap smears: denies  SCREENING STUDIES:  Last mammogram: 2025 Last colonoscopy:  n/a  MEDICATIONS: Outpatient Encounter Medications as of 12/31/2023  Medication Sig   acetaminophen  (TYLENOL ) 500 MG tablet Take 500-1,000 mg by mouth every 6 (six) hours as needed for moderate pain (pain score 4-6).   amLODipine  (NORVASC ) 10 MG tablet Take 1 tablet (10 mg total) by mouth daily.   carvedilol  (COREG ) 6.25 MG tablet Take 6.25 mg by mouth 2 (two) times daily with a meal.   FARXIGA 10 MG TABS tablet Take 10 mg by mouth daily.   losartan (COZAAR) 50 MG tablet Take 50 mg by mouth at bedtime.   Multiple Vitamins-Minerals (MULTIVITAMIN ADULTS PO) Take 1 tablet by mouth daily.   senna-docusate (SENOKOT-S) 8.6-50 MG tablet Take 1-2 tablets by mouth at bedtime. For AFTER surgery, do not take if having diarrhea   TARPEYO 4 MG CPDR Take 2 capsules by mouth 2 (two) times daily.   [DISCONTINUED] HYDROcodone -acetaminophen  (NORCO/VICODIN) 5-325 MG tablet Take 1 tablet by mouth every 8 (eight) hours as needed for severe pain (pain score 7-10). For AFTER surgery only, do not take and drive   HYDROcodone -acetaminophen  (NORCO/VICODIN) 5-325 MG tablet Take 1 tablet by mouth every 8 (eight) hours as needed for severe pain (pain score 7-10). For AFTER surgery only, do not take and drive   [DISCONTINUED] HYDROcodone -acetaminophen  (NORCO/VICODIN) 5-325 MG tablet Take 1-2 tablets by mouth every 6 (six) hours as needed.   No facility-administered encounter medications on file as of 12/31/2023.    ALLERGIES:  No Known Allergies   FAMILY HISTORY:  Family History  Problem Relation Age of Onset   Thyroid  disease Mother        ?due to accident   Hypertension Father    Hyperlipidemia Father    Heart attack Father    Hypertension Brother    Hyperlipidemia Brother    Diabetes Maternal Aunt    Hypertension Maternal Aunt    Diabetes Maternal Uncle    Hypertension Maternal Uncle    Hypertension Paternal Aunt    Hypertension Paternal Uncle    Diabetes Maternal Grandmother    Stroke Maternal  Grandmother    Leukemia Maternal Grandfather    Cancer Maternal Grandfather        lung   Hypertension Maternal Grandfather    Breast cancer Paternal Grandmother        Age 82's   Stroke Paternal Grandfather    Hypertension Paternal Grandfather    Diabetes Cousin    Asthma Cousin      SOCIAL HISTORY:  Social Connections: Moderately Integrated (06/30/2023)   Social Connection and Isolation Panel    Frequency of Communication with Friends and Family: More than three times a week    Frequency of Social Gatherings with Friends and Family: More than three times a week    Attends Religious  Services: More than 4 times per year    Active Member of Clubs or Organizations: Yes    Attends Banker Meetings: 1 to 4 times per year    Marital Status: Never married    REVIEW OF SYSTEMS:  + pelvic pain Denies appetite changes, fevers, chills, fatigue, unexplained weight changes. Denies hearing loss, neck lumps or masses, mouth sores, ringing in ears or voice changes. Denies cough or wheezing.  Denies shortness of breath. Denies chest pain or palpitations. Denies leg swelling. Denies abdominal distention, pain, blood in stools, constipation, diarrhea, nausea, vomiting, or early satiety. Denies pain with intercourse, dysuria, frequency, hematuria or incontinence. Denies hot flashes, vaginal bleeding or vaginal discharge.   Denies joint pain, back pain or muscle pain/cramps. Denies itching, rash, or wounds. Denies dizziness, headaches, numbness or seizures. Denies swollen lymph nodes or glands, denies easy bruising or bleeding. Denies anxiety, depression, confusion, or decreased concentration.  Physical Exam:  Vital Signs for this encounter:  Blood pressure (!) 147/82, pulse 66, temperature 98.1 F (36.7 C), temperature source Oral, resp. rate 19, height 5' 3 (1.6 m), weight 173 lb 3.2 oz (78.6 kg), SpO2 100%. Body mass index is 30.68 kg/m. General: Alert, oriented, no acute  distress.  HEENT: Normocephalic, atraumatic. Sclera anicteric.  Chest: Clear to auscultation bilaterally. No wheezes, rhonchi, or rales. Cardiovascular: Regular rate and rhythm, no murmurs, rubs, or gallops.  Abdomen: Normoactive bowel sounds. Soft, nondistended, nontender to palpation. No masses or hepatosplenomegaly appreciated. No palpable fluid wave.  Extremities: Grossly normal range of motion. Warm, well perfused. No edema bilaterally.  Skin: No rashes or lesions.  Lymphatics: No cervical, supraclavicular, or inguinal adenopathy.  GU:  Normal external female genitalia.  On the mons, there is a 1 cm hyperemic and slightly raised lesion, no pain with palpation.  No discharge or bleeding.             Bladder/urethra:  No lesions or masses, well supported bladder             Vagina: Well-rugated, no lesions.             Cervix: Normal appearing, no lesions.             Uterus: Uterus moves in conjunction with adnexal mass, overall measuring approximately 12-14 cm with the adnexal mass mostly appreciated anteriorly and on the right.  Difficult to tell if this is separate from the uterus.             Adnexa: See above.  Rectal: Deferred.  LABORATORY AND RADIOLOGIC DATA:  Outside medical records were reviewed to synthesize the above history, along with the history and physical obtained during the visit.   Lab Results  Component Value Date   WBC 22.0 (H) 11/23/2023   HGB 12.7 11/23/2023   HCT 38.1 11/23/2023   PLT 464 (H) 11/23/2023   GLUCOSE 140 (H) 11/23/2023   CHOL 183 07/09/2020   TRIG 120.0 07/09/2020   HDL 46.10 07/09/2020   LDLCALC 113 (H) 07/09/2020   ALT 11 11/23/2023   AST 14 (L) 11/23/2023   NA 137 11/23/2023   K 4.2 11/23/2023   CL 99 11/23/2023   CREATININE 2.16 (H) 11/23/2023   BUN 28 (H) 11/23/2023   CO2 24 11/23/2023   TSH 2.742 07/01/2023   INR 1.0 09/04/2023

## 2023-12-31 NOTE — Progress Notes (Signed)
 Patient here for a consult with Dr. Viktoria and for a pre-operative appointment prior to her scheduled surgery on 01/07/2024. She is scheduled for a possible robotic assisted total laparoscopic hysterectomy, bilateral salpingectomy, possible unilateral versus bilateral oophorectomy, possible staging if a precancer or cancer is seen. The surgery was discussed in detail.  See after visit summary for additional details.    Discussed post-op pain management in detail including the aspects of the enhanced recovery pathway.  Advised her that a new prescription would be sent in for hydrocodone /acetaminophen  and it is only to be used for after her upcoming surgery.  We discussed the use of tylenol  post-op and to monitor for a maximum of 4,000 mg in a 24 hour period.  Also prescribed sennakot to be used after surgery and to hold if having loose stools.  Discussed bowel regimen in detail.     Discussed the use of SCDs and measures to take at home to prevent DVT including frequent mobility.  Reportable signs and symptoms of DVT discussed. Post-operative instructions discussed and expectations for after surgery. Incisional care discussed as well including reportable signs and symptoms including erythema, drainage, wound separation.     30 minutes spent with the patient.  Verbalizing understanding of material discussed. No needs or concerns voiced at the end of the visit.   Advised patient to call for any needs.  Advised that her post-operative medications had been prescribed and could be picked up at any time.    This appointment is included in the global surgical bundle as pre-operative teaching and has no charge.

## 2023-12-31 NOTE — Patient Instructions (Addendum)
 Preparing for your Surgery  Plan for surgery on January 07, 2024 with Dr. Comer Dollar at [facility]. You will be scheduled for possible robotic assisted total laparoscopic hysterectomy (removal of the uterus and cervix), bilateral salpingectomy (removal of both fallopian tubes), possible unilateral versus bilateral oophorectomy (removal of one or both ovaries), possible staging if a precancer or cancer is seen.  Do not take your farxiga for 3 days before surgery. We will reach out to your provider that prescribes tarpeyo to find out when this needs to be stopped before surgery.  Pre-operative Testing -You will receive a phone call from presurgical testing at [facility] to arrange for a pre-operative appointment and lab work.  -Bring your insurance card, copy of an advanced directive if applicable, medication list  -At that visit, you will be asked to sign a consent for a possible blood transfusion in case a transfusion becomes necessary during surgery.  The need for a blood transfusion is rare but having consent is a necessary part of your care.     -You should not be taking blood thinners or aspirin at least ten days prior to surgery unless instructed by your surgeon.  -Do not take supplements such as fish oil (omega 3), red yeast rice, turmeric before your surgery. STOP TAKING AT LEAST 10 DAYS BEFORE SURGERY. You want to avoid medications with aspirin in them including headache powders such as BC or Goody's), Excedrin migraine.  -If you are taking a GLP-1 medication/injection such as Ozempic, Mounjaro, [id number], this needs to be held before surgery for at least 7 days before.  Day Before Surgery at Home -You will be asked to take in a light diet the day before surgery. You will be advised you can have clear liquids up until 3 hours before your surgery.    Eat a light diet the day before surgery.  Examples including soups, broths, toast, yogurt, mashed potatoes.  AVOID  GAS PRODUCING FOODS AND BEVERAGES. Things to avoid include carbonated beverages (fizzy beverages, sodas), raw fruits and raw vegetables (uncooked), or beans.   If your bowels are filled with gas, your surgeon will have difficulty visualizing your pelvic organs which increases your surgical risks.  Your role in recovery Your role is to become active as soon as directed by your doctor, while still giving yourself time to heal.  Rest when you feel tired. You will be asked to do the following in order to speed your recovery:  - Cough and breathe deeply. This helps to clear and expand your lungs and can prevent pneumonia after surgery.  - STAY ACTIVE WHEN YOU GET HOME. Do mild physical activity. Walking or moving your legs help your circulation and body functions return to normal. Do not try to get up or walk alone the first time after surgery.   -If you develop swelling on one leg or the other, pain in the back of your leg, redness/warmth in one of your legs, please call the office or go to the Emergency Room to have a doppler to rule out a blood clot. For shortness of breath, chest pain-seek care in the Emergency Room as soon as possible. - Actively manage your pain. Managing your pain lets you move in comfort. We will ask you to rate your pain on a scale of zero to 10. It is your responsibility to tell your doctor or nurse where and how much you hurt so your pain can be treated.  Special Considerations -If you are  diabetic, you may be placed on insulin after surgery to have closer control over your blood sugars to promote healing and recovery.  This does not mean that you will be discharged on insulin.  If applicable, your oral antidiabetics will be resumed when you are tolerating a solid diet.  -Your final pathology results from surgery should be available around one week after surgery and the results will be relayed to you when available.  -FMLA forms can be faxed to [phone] and please allow  5-7 business days for completion.  Pain Management After Surgery -You will be prescribed your pain medication and bowel regimen medications before surgery so that you can have these available when you are discharged from the hospital. The pain medication is for use ONLY AFTER surgery and a new prescription will not be given.   -Make sure that you have Tylenol  IF YOU ARE ABLE TO TAKE THESE MEDICATION at home to use on a regular basis after surgery for pain control.  -Review the attached handout on narcotic use and their risks and side effects.   Bowel Regimen -You will be prescribed Sennakot-S to take nightly to prevent constipation especially if you are taking the narcotic pain medication intermittently.  It is important to prevent constipation and drink adequate amounts of liquids. You can stop taking this medication when you are not taking pain medication and you are back on your normal bowel routine.  Risks of Surgery Risks of surgery are low but include bleeding, infection, damage to surrounding structures, re-operation, blood clots, and very rarely death.   Blood Transfusion Information (For the consent to be signed before surgery)  We will be checking your blood type before surgery so in case of emergencies, we will know what type of blood you would need.                                            WHAT IS A BLOOD TRANSFUSION?  A transfusion is the replacement of blood or some of its parts. Blood is made up of multiple cells which provide different functions. Red blood cells carry oxygen and are used for blood loss replacement. White blood cells fight against infection. Platelets control bleeding. Plasma helps clot blood. Other blood products are available for specialized needs, such as hemophilia or other clotting disorders. BEFORE THE TRANSFUSION  Who gives blood for transfusions?  You may be able to donate blood to be used at a later date on yourself (autologous  donation). Relatives can be asked to donate blood. This is generally not any safer than if you have received blood from a stranger. The same precautions are taken to ensure safety when a relative's blood is donated. Healthy volunteers who are fully evaluated to make sure their blood is safe. This is blood bank blood. Transfusion therapy is the safest it has ever been in the practice of medicine. Before blood is taken from a donor, a complete history is taken to make sure that person has no history of diseases nor engages in risky social behavior (examples are intravenous drug use or sexual activity with multiple partners). The donor's travel history is screened to minimize risk of transmitting infections, such as malaria. The donated blood is tested for signs of infectious diseases, such as HIV and hepatitis. The blood is then tested to be sure it is compatible with you in order to  minimize the chance of a transfusion reaction. If you or a relative donates blood, this is often done in anticipation of surgery and is not appropriate for emergency situations. It takes many days to process the donated blood. RISKS AND COMPLICATIONS Although transfusion therapy is very safe and saves many lives, the main dangers of transfusion include:  Getting an infectious disease. Developing a transfusion reaction. This is an allergic reaction to something in the blood you were given. Every precaution is taken to prevent this. The decision to have a blood transfusion has been considered carefully by your caregiver before blood is given. Blood is not given unless the benefits outweigh the risks.  AFTER SURGERY INSTRUCTIONS  Return to work: 4-6 weeks if applicable  Activity: 1. Be up and out of the bed during the day.  Take a nap if needed.  You may walk up steps but be careful and use the hand rail.  Stair climbing will tire you more than you think, you may need to stop part way and rest.   2. No lifting or straining  for 6 weeks over 10 pounds. No pushing, pulling, straining for 6 weeks.  3. No driving for 4-89 days when the following criteria have been met: Do not drive if you are taking narcotic pain medicine and make sure that your reaction time has returned.   4. You can shower as soon as the next day after surgery. Shower daily.  Use your regular soap and water (not directly on the incision) and pat your incision(s) dry afterwards; don't rub.  No tub baths or submerging your body in water until cleared by your surgeon. If you have the soap that was given to you by pre-surgical testing that was used before surgery, you do not need to use it afterwards because this can irritate your incisions.   5. No sexual activity and nothing in the vagina for 12 weeks.  6. You may experience a small amount of clear drainage from your incisions, which is normal.  If the drainage persists, increases, or changes color please call the office.  7. Do not use creams, lotions, or ointments such as neosporin on your incisions after surgery until advised by your surgeon because they can cause removal of the dermabond glue on your incisions.    8. You may experience vaginal spotting after surgery or when the stitches at the top of the vagina begin to dissolve.  The spotting is normal but if you experience heavy bleeding, call our office.  9. Take Tylenol  first for pain if you are able to take these medication and only use narcotic pain medication for severe pain not relieved by the Tylenol .  Monitor your Tylenol  intake to a max of 4,000 mg in a 24 hour period.   Diet: 1. Low sodium Heart Healthy Diet is recommended but you are cleared to resume your normal (before surgery) diet after your procedure.  2. It is safe to use a laxative, such as Miralax or Colace, if you have difficulty moving your bowels before surgery. You have been prescribed Sennakot-S to take at bedtime every evening after surgery to keep bowel movements regular  and to prevent constipation.    Wound Care: 1. Keep clean and dry.  Shower daily.  Reasons to call the Doctor: Fever - Oral temperature greater than 100.4 degrees Fahrenheit Foul-smelling vaginal discharge Difficulty urinating Nausea and vomiting Increased pain at the site of the incision that is unrelieved with pain medicine. Difficulty breathing with  or without chest pain New calf pain especially if only on one side Sudden, continuing increased vaginal bleeding with or without clots.   Contacts: For questions or concerns you should contact:  Dr. Comer Dollar at [phone]  Eleanor Epps, NP at [phone]  After Hours: call [phone] and have the GYN Oncologist paged/contacted (after 5 pm or on the weekends). You will speak with an after hours RN and let he or she know you have had surgery.  Messages sent via mychart are for non-urgent matters and are not responded to after hours so for urgent needs, please call the after hours number.

## 2023-12-31 NOTE — Progress Notes (Signed)
 GYNECOLOGIC ONCOLOGY NEW PATIENT CONSULTATION   Patient Name: Tanya Perkins  Patient Age: 43 y.o. Date of Service: 12/31/23 Referring Provider: Ronal Pinal, MD  Primary Care Provider: Allwardt, Mardy HERO, PA-C Consulting Provider: Comer Dollar, MD   Assessment/Plan:  Premenopausal patient with enlarging adnexal mass, suspected solid ovarian mass versus broad ligament fibroid.  Discussed recent imaging and symptoms with the patient and her mother, who accompanied her today.  We looked at CT images together.  Based on size and appearance of the mass, I suspect that this may be a solid ovarian mass.  Her symptoms are somewhat suggestive of ovarian torsion with severe pain that comes and goes for brief periods of time.  Although my suspicion for a malignant process is low, in the setting of increasing size of this mass as well as her significant symptomatology, I recommend that we proceed with surgery for therapeutic and diagnostic purposes.  Discussed possibility that this may be an adnexal mass versus fibroid.  If at the time of surgery, it appears to be an adnexal mass, plan will be to remove the right adnexa and send this for frozen section to help determine need for additional procedures.  We would plan for at least bilateral salpingectomy.  I have asked the patient to think about whether she would like concurrent hysterectomy at the time of surgery if the mass is separate from the uterus.  If this is a pedunculated or parasitic fibroid, we discussed plan for total hysterectomy with bilateral salpingectomy.  I reviewed that 1 or both ovaries could be left in situ if they are normal at the time of surgery and frozen section is benign.  We reviewed the risks and benefits of removal of bilateral ovaries at the time of surgery in terms of surgical menopause, need for hormone replacement therapy, and possible need for additional surgery in the future.  Ultimately, the patient would like to  think about whether she would want to keep 1 or both ovaries if normal in appearance.  Given her significant kidney disease, my office will reach out for preoperative clearance from her primary care provider and nephrologist.  We discussed her prior myomectomy surgery.  Given posterior location of her fibroid, we discussed that there may be some scar tissue related to this prior surgery.  I reviewed the risk that if the colon is adherent to the posterior uterus, this can increase the likelihood of damage to the colon with need for repair.  We reviewed the plan for a robotic assisted mass excision, bilateral salpingectomy, possible total hysterectomy, possible unilateral versus bilateral oophorectomy, possible staging, possible laparotomy. The risks of surgery were discussed in detail and she understands these to include infection; wound separation; hernia; vaginal cuff separation, injury to adjacent organs such as bowel, bladder, blood vessels, ureters and nerves; bleeding which may require blood transfusion; anesthesia risk; thromboembolic events; possible death; unforeseen complications; possible need for re-exploration; medical complications such as heart attack, stroke, pleural effusion and pneumonia. The patient will receive DVT and antibiotic prophylaxis as indicated. She voiced a clear understanding. She had the opportunity to ask questions. Perioperative instructions were reviewed with her. Prescriptions for post-op medications were sent to her pharmacy of choice.  A copy of this note was sent to the patient's referring provider.   70 minutes of total time was spent for this patient encounter, including preparation, face-to-face counseling with the patient and coordination of care, and documentation of the encounter.  Comer Dollar, MD  Division of  Gynecologic Oncology  Department of Obstetrics and Gynecology  [facility]   ___________________________________________  Chief Complaint: Chief Complaint  Patient presents with   Adnexal mass    History of Present Illness:  Tanya Perkins is a 43 y.o. y.o. female who is seen in consultation at the request of Dr. Cleotilde for an evaluation of asymptomatic adnexal mass.  The patient was seen in the emergency department in mid July with right abdominal and flank pain with associated nausea and emesis.  CT of the abdomen and pelvis showed a 7.8 x 13 cm right adnexal mass, mostly solid in appearance.  This had increased in size since prior imaging in 2023 where there was an adnexal mass measuring up to 6.4 cm.  No free fluid, adenopathy, or other findings concerning for metastatic disease noted.  Patient was noted to have a white blood cell count of 22,000.  Creatinine was 2.1 at that time.  Pelvic MRI was performed on 7/27 and showed uterine adenomyosis with multiple small fibroids.  A large, T1 and T2 indeterminant, homogenously contrast-enhancing mass noted to be inseparable from the right ovary but clearly distinct from the uterine fundus measures 8.5 x 6.4 x 7.7 meters.  Differential includes ovarian solid tumor such as a fibrothecoma or a broad ligament fibroid.  No evidence of metastatic disease.   11/24/23: CA-125 84.9.  The patient comes in with her mother today.  Notes overall doing well.  Has some mild pain almost daily, sometimes feels something moving in her pelvis when she walks.  She will have more significant pain every 2-3 days which can last anywhere from 10 minutes to several hours.  She describes this as feeling as if someone is sticking a knife into her.  Sometimes she will have radiation of the pain down her leg.  She uses Tylenol  and heat therapy which helped provide some relief.  Has opted not to use narcotics that she was given at her emergency department visit.  Other than the initial episode where she had some associated nausea and emesis, she  denies any further nausea or episodes of emesis.  She denies any vaginal bleeding.  She reports baseline bowel and bladder function.  She initially lost about 30 pounds with her new kidney diagnosis earlier this year and changed her diet; reports weight has been stable subsequently.   The patient presented with hypertensive urgency in February of this year at which time her blood pressure was over 200 systolic at her primary care provider's office.  She was admitted for 3 days and ultimately discharged on antihypertensive medication.  Her creatinine was quite elevated.  She follows with nephrology now.  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  She has been checking her blood pressures at home and they run on average 130s/90s.  She has a history of surgery for her large uterine fibroid in 2009.  At that time, she underwent exploratory laparotomy, myomectomy, and chromopertubation.  Findings included a single large myoma on the posterior uterine surface, otherwise normal pelvic anatomy.  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  PAST MEDICAL HISTORY:  Past Medical History:  Diagnosis Date   Fibroid    uterine   History of chicken pox    Hypertension    Thyroid  disease    Thyroid  mass 2011   ONCOCYTIC ADENOMA--NON MALIGNANT     PAST SURGICAL HISTORY:  Past Surgical History:  Procedure Laterality Date   MYOMECTOMY ABDOMINAL APPROACH  05/2007  THYROID  LOBECTOMY     LEFT - benign tumor   Tooth #20 implant      OB/GYN HISTORY:  OB History  Gravida Para Term Preterm AB Living  1 1 1   1   SAB IAB Ectopic Multiple Live Births      1    # Outcome Date GA Lbr Len/2nd Weight Sex Type Anes PTL Lv  1 Term 2007   7 lb (3.175 kg) M Vag-Spont   LIV    No LMP recorded.  Age at menarche: 54  Regular menses Hx of STDs: denies Last pap: 09/2023 - NILM, HR HPV negative History of abnormal pap smears: denies  SCREENING STUDIES:  Last mammogram: 2025 Last colonoscopy:  n/a  MEDICATIONS: Outpatient Encounter Medications as of 12/31/2023  Medication Sig   acetaminophen  (TYLENOL ) 500 MG tablet Take 500-1,000 mg by mouth every 6 (six) hours as needed for moderate pain (pain score 4-6).   amLODipine  (NORVASC ) 10 MG tablet Take 1 tablet (10 mg total) by mouth daily.   carvedilol  (COREG ) 6.25 MG tablet Take 6.25 mg by mouth 2 (two) times daily with a meal.   FARXIGA 10 MG TABS tablet Take 10 mg by mouth daily.   losartan (COZAAR) 50 MG tablet Take 50 mg by mouth at bedtime.   Multiple Vitamins-Minerals (MULTIVITAMIN ADULTS PO) Take 1 tablet by mouth daily.   senna-docusate (SENOKOT-S) 8.6-50 MG tablet Take 1-2 tablets by mouth at bedtime. For AFTER surgery, do not take if having diarrhea   TARPEYO 4 MG CPDR Take 2 capsules by mouth 2 (two) times daily.   [DISCONTINUED] HYDROcodone -acetaminophen  (NORCO/VICODIN) 5-325 MG tablet Take 1 tablet by mouth every 8 (eight) hours as needed for severe pain (pain score 7-10). For AFTER surgery only, do not take and drive   HYDROcodone -acetaminophen  (NORCO/VICODIN) 5-325 MG tablet Take 1 tablet by mouth every 8 (eight) hours as needed for severe pain (pain score 7-10). For AFTER surgery only, do not take and drive   [DISCONTINUED] HYDROcodone -acetaminophen  (NORCO/VICODIN) 5-325 MG tablet Take 1-2 tablets by mouth every 6 (six) hours as needed.   No facility-administered encounter medications on file as of 12/31/2023.    ALLERGIES:  No Known Allergies   FAMILY HISTORY:  Family History  Problem Relation Age of Onset   Thyroid  disease Mother        ?due to accident   Hypertension Father    Hyperlipidemia Father    Heart attack Father    Hypertension Brother    Hyperlipidemia Brother    Diabetes Maternal Aunt    Hypertension Maternal Aunt    Diabetes Maternal Uncle    Hypertension Maternal Uncle    Hypertension Paternal Aunt    Hypertension Paternal Uncle    Diabetes Maternal Grandmother    Stroke Maternal  Grandmother    Leukemia Maternal Grandfather    Cancer Maternal Grandfather        lung   Hypertension Maternal Grandfather    Breast cancer Paternal Grandmother        Age 82's   Stroke Paternal Grandfather    Hypertension Paternal Grandfather    Diabetes Cousin    Asthma Cousin      SOCIAL HISTORY:  Social Connections: Moderately Integrated (06/30/2023)   Social Connection and Isolation Panel    Frequency of Communication with Friends and Family: More than three times a week    Frequency of Social Gatherings with Friends and Family: More than three times a week    Attends Religious  Services: More than 4 times per year    Active Member of Clubs or Organizations: Yes    Attends Banker Meetings: 1 to 4 times per year    Marital Status: Never married    REVIEW OF SYSTEMS:  + pelvic pain Denies appetite changes, fevers, chills, fatigue, unexplained weight changes. Denies hearing loss, neck lumps or masses, mouth sores, ringing in ears or voice changes. Denies cough or wheezing.  Denies shortness of breath. Denies chest pain or palpitations. Denies leg swelling. Denies abdominal distention, pain, blood in stools, constipation, diarrhea, nausea, vomiting, or early satiety. Denies pain with intercourse, dysuria, frequency, hematuria or incontinence. Denies hot flashes, vaginal bleeding or vaginal discharge.   Denies joint pain, back pain or muscle pain/cramps. Denies itching, rash, or wounds. Denies dizziness, headaches, numbness or seizures. Denies swollen lymph nodes or glands, denies easy bruising or bleeding. Denies anxiety, depression, confusion, or decreased concentration.  Physical Exam:  Vital Signs for this encounter:  Blood pressure (!) 147/82, pulse 66, temperature 98.1 F (36.7 C), temperature source Oral, resp. rate 19, height 5' 3 (1.6 m), weight 173 lb 3.2 oz (78.6 kg), SpO2 100%. Body mass index is 30.68 kg/m. General: Alert, oriented, no acute  distress.  HEENT: Normocephalic, atraumatic. Sclera anicteric.  Chest: Clear to auscultation bilaterally. No wheezes, rhonchi, or rales. Cardiovascular: Regular rate and rhythm, no murmurs, rubs, or gallops.  Abdomen: Normoactive bowel sounds. Soft, nondistended, nontender to palpation. No masses or hepatosplenomegaly appreciated. No palpable fluid wave.  Extremities: Grossly normal range of motion. Warm, well perfused. No edema bilaterally.  Skin: No rashes or lesions.  Lymphatics: No cervical, supraclavicular, or inguinal adenopathy.  GU:  Normal external female genitalia.  On the mons, there is a 1 cm hyperemic and slightly raised lesion, no pain with palpation.  No discharge or bleeding.             Bladder/urethra:  No lesions or masses, well supported bladder             Vagina: Well-rugated, no lesions.             Cervix: Normal appearing, no lesions.             Uterus: Uterus moves in conjunction with adnexal mass, overall measuring approximately 12-14 cm with the adnexal mass mostly appreciated anteriorly and on the right.  Difficult to tell if this is separate from the uterus.             Adnexa: See above.  Rectal: Deferred.  LABORATORY AND RADIOLOGIC DATA:  Outside medical records were reviewed to synthesize the above history, along with the history and physical obtained during the visit.   Lab Results  Component Value Date   WBC 22.0 (H) 11/23/2023   HGB 12.7 11/23/2023   HCT 38.1 11/23/2023   PLT 464 (H) 11/23/2023   GLUCOSE 140 (H) 11/23/2023   CHOL 183 07/09/2020   TRIG 120.0 07/09/2020   HDL 46.10 07/09/2020   LDLCALC 113 (H) 07/09/2020   ALT 11 11/23/2023   AST 14 (L) 11/23/2023   NA 137 11/23/2023   K 4.2 11/23/2023   CL 99 11/23/2023   CREATININE 2.16 (H) 11/23/2023   BUN 28 (H) 11/23/2023   CO2 24 11/23/2023   TSH 2.742 07/01/2023   INR 1.0 09/04/2023

## 2023-12-31 NOTE — Telephone Encounter (Signed)
 Per Dr Viktoria fax records and surgical optimization form to the patient's PCP and nephrology clearance.   [place] [phone]) Dr Dennise ([phone])

## 2024-01-01 ENCOUNTER — Telehealth: Payer: Self-pay | Admitting: *Deleted

## 2024-01-01 ENCOUNTER — Other Ambulatory Visit: Payer: Self-pay | Admitting: Gynecologic Oncology

## 2024-01-01 DIAGNOSIS — N9489 Other specified conditions associated with female genital organs and menstrual cycle: Secondary | ICD-10-CM

## 2024-01-01 NOTE — Telephone Encounter (Signed)
 Spoke with patient and relayed Dr. Aureliano recommendations. Pt is to stop taking her tarpeyo 2 days prior to her surgery. Last dose will be on Monday, Sept. 1st. Pt verbalized understanding and reminded patient  to stop taking her farxiga 3 days prior to her surgery which last dose of farxiga will be Sunday, August 31st.  Pt verbalized understanding and thanked the office for calling.

## 2024-01-01 NOTE — Telephone Encounter (Signed)
 Spoke with Dr. Dennise from [facility] who called the office to advise about patient's tarpeyo. Dr. Dennise states patient can stop taking her tarpeyo 2 days prior to her scheduled surgery on Sept. 4 th, Last dose will be Monday, September 1st. And patient may resume tarpeyo 1 week after surgery at her regular dose. Office thanked Dr. Dennise for calling.   Attempted to reach patient to relay above recommendations. Unable to leave a message due to mailbox being full.

## 2024-01-01 NOTE — Telephone Encounter (Signed)
 Received kidney clearance

## 2024-01-01 NOTE — Telephone Encounter (Signed)
 Received PCP clearance.

## 2024-01-01 NOTE — Progress Notes (Signed)
 Sent message, via epic in basket, requesting orders in epic from Careers adviser.

## 2024-01-05 NOTE — Patient Instructions (Signed)
 SURGICAL WAITING ROOM VISITATION  Patients having surgery or a procedure may have no more than 2 support people in the waiting area - these visitors may rotate.    Children under the age of 15 must have an adult with them who is not the patient.  Visitors with respiratory illnesses are discouraged from visiting and should remain at home.  If the patient needs to stay at the hospital during part of their recovery, the visitor guidelines for inpatient rooms apply. Pre-op nurse will coordinate an appropriate time for 1 support person to accompany patient in pre-op.  This support person may not rotate.    Please refer to the [facility] website for the visitor guidelines for Inpatients (after your surgery is over and you are in a regular room).       Your procedure is scheduled on: 01/07/2024     Report to [facility] Main Entrance    Report to admitting at  0515 AM   Call this number if you have problems the morning of surgery [phone]   Do not eat food :After Midnight.             Eat a light diet the day before surgery. Avoid gas producing foods.    After Midnight you may have the following liquids until __ 0430____ AM  DAY OF SURGERY  Water  Non-Citrus Juices (without pulp, NO RED-Apple, White grape, White cranberry) Black Coffee (NO MILK/CREAM OR CREAMERS, sugar ok)  Clear Tea (NO MILK/CREAM OR CREAMERS, sugar ok) regular and decaf                             Plain Jell-O (NO RED)                                           Fruit ices (not with fruit pulp, NO RED)                                     Popsicles (NO RED)                                                               Sports drinks like Gatorade (NO RED)                              If you have questions, please contact your surgeon's office.      Oral Hygiene is also important to reduce your risk of infection.                                    Remember - BRUSH YOUR TEETH THE MORNING OF SURGERY WITH  YOUR REGULAR TOOTHPASTE  DENTURES WILL BE REMOVED PRIOR TO SURGERY PLEASE DO NOT APPLY Poly grip OR ADHESIVES!!!   Do NOT smoke after Midnight   Stop all vitamins and herbal supplements 7 days before surgery.   Take these medicines the morning of surgery  with A SIP OF WATER :  amlodipine , coreg              Farxiga- last dose on   DO NOT TAKE ANY ORAL DIABETIC MEDICATIONS DAY OF YOUR SURGERY  Bring CPAP mask and tubing day of surgery.                              You may not have any metal on your body including hair pins, jewelry, and body piercing             Do not wear make-up, lotions, powders, perfumes/cologne, or deodorant  Do not wear nail polish including gel and S&S, artificial/acrylic nails, or any other type of covering on natural nails including finger and toenails. If you have artificial nails, gel coating, etc. that needs to be removed by a nail salon please have this removed prior to surgery or surgery may need to be canceled/ delayed if the surgeon/ anesthesia feels like they are unable to be safely monitored.   Do not shave  48 hours prior to surgery.               Men may shave face and neck.   Do not bring valuables to the hospital. [facility] [facility] IS NOT             RESPONSIBLE   FOR VALUABLES.   Contacts, glasses, dentures or bridgework may not be worn into surgery.   Bring small overnight bag day of surgery.   DO NOT BRING YOUR HOME MEDICATIONS TO THE HOSPITAL. PHARMACY WILL DISPENSE MEDICATIONS LISTED ON YOUR MEDICATION LIST TO YOU DURING YOUR ADMISSION IN THE HOSPITAL!    Patients discharged on the day of surgery will not be allowed to drive home.  Someone NEEDS to stay with you for the first 24 hours after anesthesia.   Special Instructions: Bring a copy of your healthcare power of attorney and living will documents the day of surgery if you haven't scanned them before.              Please read over the following fact sheets you were given: IF YOU HAVE  QUESTIONS ABOUT YOUR PRE-OP INSTRUCTIONS PLEASE CALL [phone].   If you received a COVID test during your pre-op visit  it is requested that you wear a mask when out in public, stay away from anyone that may not be feeling well and notify your surgeon if you develop symptoms. If you test positive for Covid or have been in contact with anyone that has tested positive in the last 10 days please notify you surgeon.    [facility] - Preparing for Surgery Before surgery, you can play an important role.  Because skin is not sterile, your skin needs to be as free of germs as possible.  You can reduce the number of germs on your skin by washing with CHG (chlorahexidine gluconate) soap before surgery.  CHG is an antiseptic cleaner which kills germs and bonds with the skin to continue killing germs even after washing. Please DO NOT use if you have an allergy to CHG or antibacterial soaps.  If your skin becomes reddened/irritated stop using the CHG and inform your nurse when you arrive at Short Stay. Do not shave (including legs and underarms) for at least 48 hours prior to the first CHG shower.  You may shave your face/neck. Please follow these instructions carefully:  1.  Shower with CHG Soap  the night before surgery and the  morning of Surgery.  2.  If you choose to wash your hair, wash your hair first as usual with your  normal  shampoo.  3.  After you shampoo, rinse your hair and body thoroughly to remove the  shampoo.                           4.  Use CHG as you would any other liquid soap.  You can apply chg directly  to the skin and wash                       Gently with a scrungie or clean washcloth.  5.  Apply the CHG Soap to your body ONLY FROM THE NECK DOWN.   Do not use on face/ open                           Wound or open sores. Avoid contact with eyes, ears mouth and genitals (private parts).                       Wash face,  Genitals (private parts) with your normal soap.             6.  Wash  thoroughly, paying special attention to the area where your surgery  will be performed.  7.  Thoroughly rinse your body with warm water  from the neck down.  8.  DO NOT shower/wash with your normal soap after using and rinsing off  the CHG Soap.                9.  Pat yourself dry with a clean towel.            10.  Wear clean pajamas.            11.  Place clean sheets on your bed the night of your first shower and do not  sleep with pets. Day of Surgery : Do not apply any lotions/deodorants the morning of surgery.  Please wear clean clothes to the hospital/surgery center.  FAILURE TO FOLLOW THESE INSTRUCTIONS MAY RESULT IN THE CANCELLATION OF YOUR SURGERY PATIENT SIGNATURE_________________________________  NURSE SIGNATURE__________________________________  ________________________________________________________________________

## 2024-01-05 NOTE — Progress Notes (Signed)
 Anesthesia Review:  PCP: Donnella Kathrene BARRE LOV 07/15/23  Cardiologist :  PPM/ ICD: Device Orders: Rep Notified:  Chest x-ray : EKG : 07/06/23  Echo : Stress test: Cardiac Cath :   Activity level:  Sleep Study/ CPAP : Fasting Blood Sugar :      / Checks Blood Sugar -- times a day:    Blood Thinner/ Instructions /Last Dose: ASA / Instructions/ Last Dose :    12/31/23- cbc and CMP

## 2024-01-06 ENCOUNTER — Telehealth: Payer: Self-pay

## 2024-01-06 ENCOUNTER — Encounter (HOSPITAL_COMMUNITY): Payer: Self-pay

## 2024-01-06 ENCOUNTER — Other Ambulatory Visit: Payer: Self-pay

## 2024-01-06 ENCOUNTER — Encounter (HOSPITAL_COMMUNITY)
Admission: RE | Admit: 2024-01-06 | Discharge: 2024-01-06 | Disposition: A | Discharge status: Home / Self Care | Source: Ambulatory Visit | Attending: Gynecologic Oncology | Admitting: Gynecologic Oncology

## 2024-01-06 DIAGNOSIS — I129 Hypertensive chronic kidney disease with stage 1 through stage 4 chronic kidney disease, or unspecified chronic kidney disease: Secondary | ICD-10-CM | POA: Diagnosis not present

## 2024-01-06 DIAGNOSIS — D271 Benign neoplasm of left ovary: Secondary | ICD-10-CM | POA: Diagnosis not present

## 2024-01-06 DIAGNOSIS — Z79899 Other long term (current) drug therapy: Secondary | ICD-10-CM | POA: Diagnosis not present

## 2024-01-06 DIAGNOSIS — E079 Disorder of thyroid, unspecified: Secondary | ICD-10-CM | POA: Diagnosis not present

## 2024-01-06 DIAGNOSIS — Z87891 Personal history of nicotine dependence: Secondary | ICD-10-CM | POA: Diagnosis not present

## 2024-01-06 DIAGNOSIS — N9489 Other specified conditions associated with female genital organs and menstrual cycle: Secondary | ICD-10-CM | POA: Diagnosis not present

## 2024-01-06 DIAGNOSIS — Z01818 Encounter for other preprocedural examination: Secondary | ICD-10-CM | POA: Insufficient documentation

## 2024-01-06 DIAGNOSIS — D270 Benign neoplasm of right ovary: Secondary | ICD-10-CM | POA: Diagnosis not present

## 2024-01-06 DIAGNOSIS — D1809 Hemangioma of other sites: Secondary | ICD-10-CM | POA: Diagnosis not present

## 2024-01-06 DIAGNOSIS — N1832 Chronic kidney disease, stage 3b: Secondary | ICD-10-CM | POA: Diagnosis not present

## 2024-01-06 HISTORY — DX: Chronic kidney disease, stage 3b: N18.32

## 2024-01-06 NOTE — Telephone Encounter (Signed)
 Pre Eleanor Epps NP recent lab results have been faxed to PCP Alyssa Allwardt and  Nephrologist Dr.Singh [facility]

## 2024-01-06 NOTE — Anesthesia Preprocedure Evaluation (Signed)
 Anesthesia Evaluation  Patient identified by MRN, date of birth, ID band Patient awake    Reviewed: Allergy & Precautions, NPO status , Patient's Chart, lab work & pertinent test results  History of Anesthesia Complications Negative for: history of anesthetic complications  Airway Mallampati: II  TM Distance: >3 FB Neck ROM: Full    Dental no notable dental hx.    Pulmonary former smoker   Pulmonary exam normal        Cardiovascular hypertension, Pt. on medications and Pt. on home beta blockers Normal cardiovascular exam     Neuro/Psych negative neurological ROS     GI/Hepatic negative GI ROS, Neg liver ROS,,,  Endo/Other  negative endocrine ROS    Renal/GU Renal InsufficiencyRenal disease (IgA nephropathy)  negative genitourinary   Musculoskeletal negative musculoskeletal ROS (+)    Abdominal   Peds  Hematology  (+) Blood dyscrasia (Hgb 11.8), anemia   Anesthesia Other Findings Day of surgery medications reviewed with patient.  Reproductive/Obstetrics ADNEXAL MASS                              Anesthesia Physical Anesthesia Plan  ASA: 3  Anesthesia Plan: General   Post-op Pain Management: Tylenol  PO (pre-op)*   Induction: Intravenous  PONV Risk Score and Plan: 4 or greater and Treatment may vary due to age or medical condition, Ondansetron , Dexamethasone , Midazolam , Propofol  infusion, TIVA and Scopolamine  patch - Pre-op  Airway Management Planned: Oral ETT  Additional Equipment: None  Intra-op Plan:   Post-operative Plan: Extubation in OR  Informed Consent: I have reviewed the patients History and Physical, chart, labs and discussed the procedure including the risks, benefits and alternatives for the proposed anesthesia with the patient or authorized representative who has indicated his/her understanding and acceptance.     Dental advisory given  Plan Discussed with:  CRNA  Anesthesia Plan Comments:          Anesthesia Quick Evaluation

## 2024-01-06 NOTE — Discharge Instructions (Signed)
 AFTER SURGERY INSTRUCTIONS   Return to work: 4-6 weeks if applicable  YOU NEED TO BE ON AN AGGRESSIVE BOWEL REGIMEN. YOU CAN CONTINUE WITH THE COLACE, START TAKING THE SENNA, AND CAN ADD IN A CAPFUL OF MIRALAX DAILY. IF YOU START HAVING LOOSE STOOLS, YOU CAN STOP TAKING THE MIRALAX.   Activity: 1. Be up and out of the bed during the day.  Take a nap if needed.  You may walk up steps but be careful and use the hand rail.  Stair climbing will tire you more than you think, you may need to stop part way and rest.    2. No lifting or straining for 6 weeks over 10 pounds. No pushing, pulling, straining for 6 weeks.   3. No driving for 4-89 days when the following criteria have been met: Do not drive if you are taking narcotic pain medicine and make sure that your reaction time has returned.    4. You can shower as soon as the next day after surgery. Shower daily.  Use your regular soap and water  (not directly on the incision) and pat your incision(s) dry afterwards; don't rub.  No tub baths or submerging your body in water  until cleared by your surgeon. If you have the soap that was given to you by pre-surgical testing that was used before surgery, you do not need to use it afterwards because this can irritate your incisions.    5. No sexual activity and nothing in the vagina for 12 weeks.   6. You may experience a small amount of clear drainage from your incisions, which is normal.  If the drainage persists, increases, or changes color please call the office.   7. Do not use creams, lotions, or ointments such as neosporin on your incisions after surgery until advised by your surgeon because they can cause removal of the dermabond glue on your incisions.     8. You may experience vaginal spotting after surgery or when the stitches at the top of the vagina begin to dissolve.  The spotting is normal but if you experience heavy bleeding, call our office.   9. Take Tylenol  first for pain if you are able  to take these medication and only use narcotic pain medication for severe pain not relieved by the Tylenol .  Monitor your Tylenol  intake to a max of 4,000 mg in a 24 hour period.    Diet: 1. Low sodium Heart Healthy Diet is recommended but you are cleared to resume your normal (before surgery) diet after your procedure.   2. It is safe to use a laxative, such as Miralax or Colace, if you have difficulty moving your bowels before surgery. You have been prescribed Sennakot-S to take at bedtime every evening after surgery to keep bowel movements regular and to prevent constipation.     Wound Care: 1. Keep clean and dry.  Shower daily.   Reasons to call the Doctor: Fever - Oral temperature greater than 100.4 degrees Fahrenheit Foul-smelling vaginal discharge Difficulty urinating Nausea and vomiting Increased pain at the site of the incision that is unrelieved with pain medicine. Difficulty breathing with or without chest pain New calf pain especially if only on one side Sudden, continuing increased vaginal bleeding with or without clots.   Contacts: For questions or concerns you should contact:   Dr. Comer Dollar at [phone]   Eleanor Epps, NP at [phone]   After Hours: call [phone] and have the GYN Oncologist paged/contacted (after 5 pm  or on the weekends). You will speak with an after hours RN and let he or she know you have had surgery.   Messages sent via mychart are for non-urgent matters and are not responded to after hours so for urgent needs, please call the after hours number.

## 2024-01-06 NOTE — Telephone Encounter (Signed)
-----   Message from Eleanor JONETTA Epps sent at 01/06/2024  3:07 PM EDT ----- Please forward to patient's PCP and nephrologist if she has one ----- Message ----- From: Orlando Arnulfo DEL, RN Sent: 01/06/2024   3:02 PM EDT To: Comer JONELLE Dollar, MD; Eleanor JONETTA Epps, NP

## 2024-01-06 NOTE — Progress Notes (Addendum)
 Anesthesia Chart Review   Case: [id number] Date/Time: 01/07/24 0715   Procedures:      SALPINGECTOMY, ROBOT-ASSISTED (Bilateral) - POSSIBLE UNILATERAL VERSUS BILATERAL OOPHORECTOMY, POSSIBLE STAGING     HYSTERECTOMY, TOTAL, ROBOT-ASSISTED     WIDE EXCISION VULVECTOMY - VULVAR LESION EXCISION   Anesthesia type: General   Pre-op diagnosis: ADNEXAL MASS   Location: [facility] ROOM 05 / [facility]   Surgeons: Viktoria Comer SAUNDERS, MD       DISCUSSION:43 y.o. former smoker with h/o HTN, CKD 3b, adnexal mass scheduled for above procedure 01/07/2024 with Dr. Comer Viktoria.   Pt follows with nephrology after admission for AKI earlier this year. During admission attributed to hypertensive emergency. Creatinine has continued to rise per nephrology notes. 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 Tarpeyo started 7/4. Pt last seen by nephrology 12/02/23. Nephrology aware of upcoming procedure, no contraindications noted.   10/08/2023 Creatinine 2.03 11/23/2023 Creatinine 2.16 12/31/2023 Creatinine 2.22  Discussed with Dr. Peggye, ok to proceed.  Repeat BMP DOS.  VS: There were no vitals taken for this visit.  PROVIDERS: Allwardt, Mardy HERO, PA-C   LABS: Labs reviewed: Acceptable for surgery. (all labs ordered are listed, but only abnormal results are displayed)  Labs Reviewed - No data to display   IMAGES:   EKG:   CV:  Past Medical History:  Diagnosis Date   Fibroid    uterine   History of chicken pox    Hypertension    Thyroid  disease    Thyroid  mass 2011   ONCOCYTIC ADENOMA--NON MALIGNANT    Past Surgical History:  Procedure Laterality Date   MYOMECTOMY ABDOMINAL APPROACH  05/2007   THYROID  LOBECTOMY     LEFT - benign tumor   Tooth #20 implant      MEDICATIONS:  acetaminophen  (TYLENOL ) 500 MG tablet   amLODipine  (NORVASC ) 10 MG tablet   amLODipine  (NORVASC ) 5 MG tablet   carvedilol  (COREG ) 6.25 MG tablet    FARXIGA 10 MG TABS tablet   HYDROcodone -acetaminophen  (NORCO/VICODIN) 5-325 MG tablet   losartan (COZAAR) 50 MG tablet   Multiple Vitamins-Minerals (MULTIVITAMIN ADULTS PO)   senna-docusate (SENOKOT-S) 8.6-50 MG tablet   TARPEYO 4 MG CPDR   No current facility-administered medications for this encounter.   Harlene Hoots Ward, PA-C [facility] Pre-Surgical Testing [phone]

## 2024-01-06 NOTE — Telephone Encounter (Signed)
 Telephone call to check on pre-operative status.  Patient compliant with pre-operative instructions.  Reinforced nothing to eat after midnight. Clear liquids until 0415. Patient to arrive at 0515.  No questions or concerns voiced.  Instructed to call for any needs.  Last dose of Farxiga was Sunday 8/31 Last dose of Tarpeyo was Monday 9/1

## 2024-01-07 ENCOUNTER — Telehealth: Payer: Self-pay | Admitting: *Deleted

## 2024-01-07 ENCOUNTER — Ambulatory Visit (HOSPITAL_COMMUNITY)
Admission: RE | Admit: 2024-01-07 | Discharge: 2024-01-07 | Disposition: A | Discharge status: Home / Self Care | Attending: Gynecologic Oncology | Admitting: Gynecologic Oncology

## 2024-01-07 ENCOUNTER — Encounter (HOSPITAL_COMMUNITY): Payer: Self-pay | Admitting: Physician Assistant

## 2024-01-07 ENCOUNTER — Ambulatory Visit (HOSPITAL_COMMUNITY): Payer: Self-pay | Admitting: Anesthesiology

## 2024-01-07 ENCOUNTER — Other Ambulatory Visit: Payer: Self-pay

## 2024-01-07 ENCOUNTER — Encounter (HOSPITAL_COMMUNITY): Payer: Self-pay | Admitting: Gynecologic Oncology

## 2024-01-07 ENCOUNTER — Encounter (HOSPITAL_COMMUNITY)
Admission: RE | Disposition: A | Discharge status: Home / Self Care | Payer: Self-pay | Source: Home / Self Care | Attending: Gynecologic Oncology

## 2024-01-07 DIAGNOSIS — N1832 Chronic kidney disease, stage 3b: Secondary | ICD-10-CM | POA: Insufficient documentation

## 2024-01-07 DIAGNOSIS — Z87891 Personal history of nicotine dependence: Secondary | ICD-10-CM | POA: Insufficient documentation

## 2024-01-07 DIAGNOSIS — I129 Hypertensive chronic kidney disease with stage 1 through stage 4 chronic kidney disease, or unspecified chronic kidney disease: Secondary | ICD-10-CM | POA: Diagnosis not present

## 2024-01-07 DIAGNOSIS — E079 Disorder of thyroid, unspecified: Secondary | ICD-10-CM | POA: Diagnosis not present

## 2024-01-07 DIAGNOSIS — Z79899 Other long term (current) drug therapy: Secondary | ICD-10-CM | POA: Insufficient documentation

## 2024-01-07 DIAGNOSIS — N838 Other noninflammatory disorders of ovary, fallopian tube and broad ligament: Secondary | ICD-10-CM | POA: Diagnosis not present

## 2024-01-07 DIAGNOSIS — D270 Benign neoplasm of right ovary: Secondary | ICD-10-CM | POA: Diagnosis not present

## 2024-01-07 DIAGNOSIS — N9489 Other specified conditions associated with female genital organs and menstrual cycle: Secondary | ICD-10-CM | POA: Diagnosis not present

## 2024-01-07 DIAGNOSIS — I10 Essential (primary) hypertension: Secondary | ICD-10-CM | POA: Diagnosis not present

## 2024-01-07 DIAGNOSIS — Z01818 Encounter for other preprocedural examination: Secondary | ICD-10-CM | POA: Diagnosis not present

## 2024-01-07 DIAGNOSIS — D271 Benign neoplasm of left ovary: Secondary | ICD-10-CM

## 2024-01-07 DIAGNOSIS — N9089 Other specified noninflammatory disorders of vulva and perineum: Secondary | ICD-10-CM | POA: Diagnosis not present

## 2024-01-07 DIAGNOSIS — D1809 Hemangioma of other sites: Secondary | ICD-10-CM

## 2024-01-07 HISTORY — PX: VULVECTOMY: SHX1086

## 2024-01-07 HISTORY — PX: ROBOTIC ASSISTED BILATERAL SALPINGO OOPHERECTOMY: SHX6078

## 2024-01-07 LAB — COMPREHENSIVE METABOLIC PANEL WITH GFR
ALT: 11 U/L (ref 0–44)
AST: 13 U/L — ABNORMAL LOW (ref 15–41)
Albumin: 3.5 g/dL (ref 3.5–5.0)
Alkaline Phosphatase: 49 U/L (ref 38–126)
Anion gap: 10 (ref 5–15)
BUN: 27 mg/dL — ABNORMAL HIGH (ref 6–20)
CO2: 27 mmol/L (ref 22–32)
Calcium: 9.6 mg/dL (ref 8.9–10.3)
Chloride: 102 mmol/L (ref 98–111)
Creatinine, Ser: 2.01 mg/dL — ABNORMAL HIGH (ref 0.44–1.00)
GFR, Estimated: 31 mL/min — ABNORMAL LOW (ref >60)
Glucose, Bld: 95 mg/dL (ref 70–99)
Potassium: 4.5 mmol/L (ref 3.5–5.1)
Sodium: 139 mmol/L (ref 135–145)
Total Bilirubin: 0.4 mg/dL (ref 0.0–1.2)
Total Protein: 6 g/dL — ABNORMAL LOW (ref 6.5–8.1)

## 2024-01-07 LAB — CBC
HCT: 38.9 % (ref 36.0–46.0)
Hemoglobin: 11.9 g/dL — ABNORMAL LOW (ref 12.0–15.0)
MCH: 26.9 pg (ref 26.0–34.0)
MCHC: 30.6 g/dL (ref 30.0–36.0)
MCV: 87.8 fL (ref 80.0–100.0)
Platelets: 380 K/uL (ref 150–400)
RBC: 4.43 MIL/uL (ref 3.87–5.11)
RDW: 13.3 % (ref 11.5–15.5)
WBC: 12.8 K/uL — ABNORMAL HIGH (ref 4.0–10.5)
nRBC: 0 % (ref 0.0–0.2)

## 2024-01-07 LAB — TYPE AND SCREEN
ABO/RH(D): B POS
Antibody Screen: NEGATIVE

## 2024-01-07 LAB — POCT PREGNANCY, URINE: Preg Test, Ur: NEGATIVE

## 2024-01-07 SURGERY — SALPINGO-OOPHORECTOMY, BILATERAL, ROBOT-ASSISTED
Anesthesia: General

## 2024-01-07 MED ORDER — BUPIVACAINE LIPOSOME 1.3 % IJ SUSP
INTRAMUSCULAR | Status: DC | PRN
  Administered 2024-01-07: 20 mL

## 2024-01-07 MED ORDER — HYDROMORPHONE HCL 1 MG/ML IJ SOLN
INTRAMUSCULAR | Status: AC
Start: 2024-01-07 — End: 2024-01-07
  Filled 2024-01-07: qty 1

## 2024-01-07 MED ORDER — BUPIVACAINE HCL (PF) 0.25 % IJ SOLN
INTRAMUSCULAR | Status: DC | PRN
  Administered 2024-01-07: 25 mL

## 2024-01-07 MED ORDER — DROPERIDOL 2.5 MG/ML IJ SOLN
INTRAMUSCULAR | Status: AC
  Filled 2024-01-07: qty 2

## 2024-01-07 MED ORDER — CHLORHEXIDINE GLUCONATE 0.12 % MT SOLN
15.0000 mL | Freq: Once | OROMUCOSAL | Status: AC
  Administered 2024-01-07: 15 mL via OROMUCOSAL

## 2024-01-07 MED ORDER — DEXAMETHASONE SODIUM PHOSPHATE 10 MG/ML IJ SOLN: 4.0000 mg | INTRAMUSCULAR | Status: DC

## 2024-01-07 MED ORDER — PROPOFOL 10 MG/ML IV BOLUS
INTRAVENOUS | Status: AC
  Filled 2024-01-07: qty 20

## 2024-01-07 MED ORDER — ROCURONIUM BROMIDE 10 MG/ML (PF) SYRINGE
PREFILLED_SYRINGE | INTRAVENOUS | Status: DC | PRN
  Administered 2024-01-07: 10 mg via INTRAVENOUS
  Administered 2024-01-07: 30 mg via INTRAVENOUS
  Administered 2024-01-07: 70 mg via INTRAVENOUS

## 2024-01-07 MED ORDER — ACETIC ACID 5 % SOLN
Status: AC
  Filled 2024-01-07: qty 36

## 2024-01-07 MED ORDER — BUPIVACAINE LIPOSOME 1.3 % IJ SUSP
INTRAMUSCULAR | Status: AC
  Filled 2024-01-07: qty 20

## 2024-01-07 MED ORDER — MIDAZOLAM HCL 2 MG/2ML IJ SOLN
INTRAMUSCULAR | Status: AC
  Filled 2024-01-07: qty 2

## 2024-01-07 MED ORDER — DROPERIDOL 2.5 MG/ML IJ SOLN
0.6250 mg | Freq: Once | INTRAMUSCULAR | Status: AC | PRN
  Administered 2024-01-07: 0.625 mg via INTRAVENOUS

## 2024-01-07 MED ORDER — MIDAZOLAM HCL 2 MG/2ML IJ SOLN
INTRAMUSCULAR | Status: DC | PRN
  Administered 2024-01-07: 2 mg via INTRAVENOUS

## 2024-01-07 MED ORDER — ONDANSETRON HCL 4 MG/2ML IJ SOLN
INTRAMUSCULAR | Status: DC | PRN
  Administered 2024-01-07: 4 mg via INTRAVENOUS

## 2024-01-07 MED ORDER — HEPARIN SODIUM (PORCINE) 5000 UNIT/ML IJ SOLN
5000.0000 [IU] | INTRAMUSCULAR | Status: AC
  Administered 2024-01-07: 5000 [IU] via SUBCUTANEOUS
  Filled 2024-01-07: qty 1

## 2024-01-07 MED ORDER — PROPOFOL 500 MG/50ML IV EMUL
INTRAVENOUS | Status: DC | PRN
  Administered 2024-01-07: 125 ug/kg/min via INTRAVENOUS

## 2024-01-07 MED ORDER — BUPIVACAINE HCL (PF) 0.25 % IJ SOLN
INTRAMUSCULAR | Status: AC
  Filled 2024-01-07: qty 30

## 2024-01-07 MED ORDER — SODIUM CHLORIDE 0.9 % IV SOLN: INTRAVENOUS | Status: DC

## 2024-01-07 MED ORDER — SUGAMMADEX SODIUM 200 MG/2ML IV SOLN
INTRAVENOUS | Status: DC | PRN
  Administered 2024-01-07: 200 mg via INTRAVENOUS

## 2024-01-07 MED ORDER — HYDROMORPHONE HCL 1 MG/ML IJ SOLN
INTRAMUSCULAR | Status: AC
  Filled 2024-01-07: qty 1

## 2024-01-07 MED ORDER — ACETAMINOPHEN 500 MG PO TABS
1000.0000 mg | ORAL_TABLET | Freq: Once | ORAL | Status: AC
  Filled 2024-01-07: qty 2

## 2024-01-07 MED ORDER — FENTANYL CITRATE (PF) 250 MCG/5ML IJ SOLN
INTRAMUSCULAR | Status: DC | PRN
  Administered 2024-01-07: 25 ug via INTRAVENOUS
  Administered 2024-01-07: 100 ug via INTRAVENOUS
  Administered 2024-01-07: 25 ug via INTRAVENOUS
  Administered 2024-01-07 (×2): 50 ug via INTRAVENOUS

## 2024-01-07 MED ORDER — LIDOCAINE HCL (CARDIAC) PF 100 MG/5ML IV SOSY
PREFILLED_SYRINGE | INTRAVENOUS | Status: DC | PRN
  Administered 2024-01-07: 100 mg via INTRAVENOUS

## 2024-01-07 MED ORDER — DEXAMETHASONE SODIUM PHOSPHATE 10 MG/ML IJ SOLN
INTRAMUSCULAR | Status: DC | PRN
  Administered 2024-01-07: 10 mg via INTRAVENOUS

## 2024-01-07 MED ORDER — CEFAZOLIN SODIUM 1 G IJ SOLR
INTRAMUSCULAR | Status: AC
  Filled 2024-01-07: qty 20

## 2024-01-07 MED ORDER — ACETAMINOPHEN 500 MG PO TABS
1000.0000 mg | ORAL_TABLET | ORAL | Status: AC
  Administered 2024-01-07: 1000 mg via ORAL

## 2024-01-07 MED ORDER — PHENYLEPHRINE HCL-NACL 20-0.9 MG/250ML-% IV SOLN
INTRAVENOUS | Status: DC | PRN
  Administered 2024-01-07: 25 ug/min via INTRAVENOUS

## 2024-01-07 MED ORDER — DIPHENHYDRAMINE HCL 50 MG/ML IJ SOLN
INTRAMUSCULAR | Status: DC | PRN
  Administered 2024-01-07: 12.5 mg via INTRAVENOUS

## 2024-01-07 MED ORDER — PROPOFOL 10 MG/ML IV BOLUS
INTRAVENOUS | Status: DC | PRN
  Administered 2024-01-07: 180 mg via INTRAVENOUS

## 2024-01-07 MED ORDER — LACTATED RINGERS IR SOLN
Status: DC | PRN
  Administered 2024-01-07: 1000 mL

## 2024-01-07 MED ORDER — PHENYLEPHRINE 80 MCG/ML (10ML) SYRINGE FOR IV PUSH (FOR BLOOD PRESSURE SUPPORT)
PREFILLED_SYRINGE | INTRAVENOUS | Status: DC | PRN
  Administered 2024-01-07: 80 ug via INTRAVENOUS
  Administered 2024-01-07: 160 ug via INTRAVENOUS

## 2024-01-07 MED ORDER — SCOPOLAMINE 1 MG/3DAYS TD PT72
1.0000 | MEDICATED_PATCH | TRANSDERMAL | Status: DC
  Administered 2024-01-07: 1 mg via TRANSDERMAL
  Filled 2024-01-07: qty 1

## 2024-01-07 MED ORDER — CEFAZOLIN SODIUM-DEXTROSE 2-3 GM-%(50ML) IV SOLR
INTRAVENOUS | Status: DC | PRN
  Administered 2024-01-07: 2 g via INTRAVENOUS

## 2024-01-07 MED ORDER — HYDROMORPHONE HCL 1 MG/ML IJ SOLN
0.2500 mg | INTRAMUSCULAR | Status: DC | PRN
  Administered 2024-01-07: 0.25 mg via INTRAVENOUS
  Administered 2024-01-07 (×2): 0.5 mg via INTRAVENOUS

## 2024-01-07 MED ORDER — FENTANYL CITRATE (PF) 250 MCG/5ML IJ SOLN
INTRAMUSCULAR | Status: AC
Start: 2024-01-07 — End: 2024-01-07
  Filled 2024-01-07: qty 5

## 2024-01-07 MED ORDER — MONSELS FERRIC SUBSULFATE EX SOLN
CUTANEOUS | Status: AC
  Filled 2024-01-07: qty 8

## 2024-01-07 MED ORDER — STERILE WATER FOR IRRIGATION IR SOLN
Status: DC | PRN
  Administered 2024-01-07: 1000 mL

## 2024-01-07 SURGICAL SUPPLY — 87 items
APPLICATOR SURGIFLO ENDO (HEMOSTASIS) IMPLANT
BAG COUNTER SPONGE SURGICOUNT (BAG) IMPLANT
BAG LAPAROSCOPIC 12 15 PORT 16 (BASKET) IMPLANT
BLADE SURG 15 STRL LF DISP TIS (BLADE) IMPLANT
BLADE SURG SZ10 CARB STEEL (BLADE) IMPLANT
COVER BACK TABLE 60X90IN (DRAPES) ×2 IMPLANT
COVER TIP SHEARS 8 DVNC (MISCELLANEOUS) ×2 IMPLANT
DERMABOND ADVANCED .7 DNX12 (GAUZE/BANDAGES/DRESSINGS) ×2 IMPLANT
DRAPE ARM DVNC X/XI (DISPOSABLE) ×8 IMPLANT
DRAPE COLUMN DVNC XI (DISPOSABLE) ×2 IMPLANT
DRAPE SHEET LG 3/4 BI-LAMINATE (DRAPES) ×2 IMPLANT
DRAPE SURG IRRIG POUCH 19X23 (DRAPES) ×2 IMPLANT
DRIVER NDL MEGA SUTCUT DVNCXI (INSTRUMENTS) ×2 IMPLANT
DRIVER NDLE MEGA SUTCUT DVNCXI (INSTRUMENTS) ×2 IMPLANT
DRSG OPSITE POSTOP 4X6 (GAUZE/BANDAGES/DRESSINGS) IMPLANT
DRSG OPSITE POSTOP 4X8 (GAUZE/BANDAGES/DRESSINGS) IMPLANT
DRSG TELFA 3X8 NADH STRL (GAUZE/BANDAGES/DRESSINGS) ×2 IMPLANT
ELECT PENCIL ROCKER SW 15FT (MISCELLANEOUS) IMPLANT
ELECT REM PT RETURN 15FT ADLT (MISCELLANEOUS) ×2 IMPLANT
FORCEPS BPLR FENES DVNC XI (FORCEP) ×2 IMPLANT
FORCEPS PROGRASP DVNC XI (FORCEP) ×2 IMPLANT
GAUZE 4X4 16PLY ~~LOC~~+RFID DBL (SPONGE) ×4 IMPLANT
GAUZE SPONGE 4X4 12PLY STRL (GAUZE/BANDAGES/DRESSINGS) ×2 IMPLANT
GLOVE BIO SURGEON STRL SZ 6 (GLOVE) ×8 IMPLANT
GLOVE BIO SURGEON STRL SZ 6.5 (GLOVE) ×2 IMPLANT
GOWN STRL REUS W/ TWL LRG LVL3 (GOWN DISPOSABLE) ×8 IMPLANT
GRASPER SUT TROCAR 14GX15 (MISCELLANEOUS) IMPLANT
HOLDER FOLEY CATH W/STRAP (MISCELLANEOUS) IMPLANT
IRRIGATION SUCT STRKRFLW 2 WTP (MISCELLANEOUS) ×2 IMPLANT
KIT PROCEDURE DVNC SI (MISCELLANEOUS) IMPLANT
KIT TURNOVER KIT A (KITS) ×2 IMPLANT
LIGASURE IMPACT 36 18CM CVD LR (INSTRUMENTS) IMPLANT
MANIPULATOR ADVINCU DEL 3.0 PL (MISCELLANEOUS) IMPLANT
MANIPULATOR ADVINCU DEL 3.5 PL (MISCELLANEOUS) IMPLANT
MANIPULATOR UTERINE 4.5 ZUMI (MISCELLANEOUS) IMPLANT
NDL HYPO 21X1.5 SAFETY (NEEDLE) ×2 IMPLANT
NDL HYPO 25X1 1.5 SAFETY (NEEDLE) ×2 IMPLANT
NDL SPNL 18GX3.5 QUINCKE PK (NEEDLE) IMPLANT
NDL SPNL 22GX7 QUINCKE BK (NEEDLE) IMPLANT
NEEDLE HYPO 21X1.5 SAFETY (NEEDLE) ×2 IMPLANT
NEEDLE HYPO 25X1 1.5 SAFETY (NEEDLE) ×2 IMPLANT
NEEDLE SPNL 18GX3.5 QUINCKE PK (NEEDLE) IMPLANT
NEEDLE SPNL 22GX7 QUINCKE BK (NEEDLE) IMPLANT
NS IRRIG 1000ML POUR BTL (IV SOLUTION) ×2 IMPLANT
OBTURATOR OPTICALSTD 8 DVNC (TROCAR) ×2 IMPLANT
PACK PERINEAL COLD (PAD) ×2 IMPLANT
PACK ROBOT GYN CUSTOM WL (TRAY / TRAY PROCEDURE) ×2 IMPLANT
PACK VAGINAL WOMENS (CUSTOM PROCEDURE TRAY) ×2 IMPLANT
PAD POSITIONING PINK XL (MISCELLANEOUS) ×2 IMPLANT
PAD PREP 24X48 CUFFED NSTRL (MISCELLANEOUS) ×2 IMPLANT
PORT ACCESS TROCAR AIRSEAL 12 (TROCAR) IMPLANT
SCISSORS LAP 5X45 EPIX DISP (ENDOMECHANICALS) IMPLANT
SCISSORS MNPLR CVD DVNC XI (INSTRUMENTS) ×2 IMPLANT
SCOPETTES 8 STERILE (MISCELLANEOUS) IMPLANT
SCRUB CHG 4% DYNA-HEX 4OZ (MISCELLANEOUS) IMPLANT
SEAL UNIV 5-12 XI (MISCELLANEOUS) ×8 IMPLANT
SET TRI-LUMEN FLTR TB AIRSEAL (TUBING) ×2 IMPLANT
SOL PREP POV-IOD 4OZ 10% (MISCELLANEOUS) ×2 IMPLANT
SPIKE FLUID TRANSFER (MISCELLANEOUS) ×2 IMPLANT
SPONGE T-LAP 18X18 ~~LOC~~+RFID (SPONGE) IMPLANT
SUT MNCRL AB 4-0 PS2 18 (SUTURE) IMPLANT
SUT PDS AB 1 TP1 96 (SUTURE) IMPLANT
SUT STRATA 2-0 15 CT-1.5 (SUTURE) IMPLANT
SUT STRATA PDS 0 30 CT-2.5 (SUTURE) IMPLANT
SUT V-LOC 180 0-0 GS22 (SUTURE) IMPLANT
SUT VIC AB 0 CT1 27XBRD ANTBC (SUTURE) ×2 IMPLANT
SUT VIC AB 2-0 CT1 TAPERPNT 27 (SUTURE) IMPLANT
SUT VIC AB 2-0 SH 27X BRD (SUTURE) ×2 IMPLANT
SUT VIC AB 3-0 SH 27XBRD (SUTURE) ×2 IMPLANT
SUT VIC AB 4-0 PS2 18 (SUTURE) ×4 IMPLANT
SUT VIC AB 4-0 PS2 27 (SUTURE) ×2 IMPLANT
SUT VICRYL 0 27 CT2 27 ABS (SUTURE) ×2 IMPLANT
SUT VLOC 180 0 9IN GS21 (SUTURE) IMPLANT
SYR 10ML LL (SYRINGE) IMPLANT
SYR BULB IRRIG 60ML STRL (SYRINGE) ×2 IMPLANT
SYR CONTROL 10ML LL (SYRINGE) IMPLANT
SYSTEM BAG RETRIEVAL 10MM (BASKET) IMPLANT
SYSTEM WOUND ALEXIS 18CM MED (MISCELLANEOUS) IMPLANT
TOWEL OR 17X26 10 PK STRL BLUE (TOWEL DISPOSABLE) ×2 IMPLANT
TRAP SPECIMEN MUCUS 40CC (MISCELLANEOUS) IMPLANT
TRAY FOLEY MTR SLVR 16FR STAT (SET/KITS/TRAYS/PACK) ×2 IMPLANT
TROCAR PORT AIRSEAL 5X120 (TROCAR) IMPLANT
TROCAR XCEL NON-BLD 5MMX100MML (ENDOMECHANICALS) ×2 IMPLANT
UNDERPAD 30X36 HEAVY ABSORB (UNDERPADS AND DIAPERS) ×4 IMPLANT
WATER STERILE IRR 1000ML POUR (IV SOLUTION) ×2 IMPLANT
YANKAUER SUCT BULB TIP 10FT TU (MISCELLANEOUS) IMPLANT
YANKAUER SUCT BULB TIP NO VENT (SUCTIONS) ×2 IMPLANT

## 2024-01-07 NOTE — Anesthesia Procedure Notes (Signed)
 Procedure Name: Intubation Date/Time: 01/07/2024 7:40 AM  Performed by: Cena Epps, CRNAPre-anesthesia Checklist: Patient identified, Emergency Drugs available, Suction available and Patient being monitored Patient Re-evaluated:Patient Re-evaluated prior to induction Oxygen Delivery Method: Circle System Utilized Preoxygenation: Pre-oxygenation with 100% oxygen Induction Type: IV induction Ventilation: Mask ventilation without difficulty Laryngoscope Size: Mac and 3 Grade View: Grade I Tube type: Oral Tube size: 7.0 mm Number of attempts: 1 Airway Equipment and Method: Stylet and Oral airway Placement Confirmation: ETT inserted through vocal cords under direct vision, positive ETCO2 and breath sounds checked- equal and bilateral Secured at: 22 cm Tube secured with: Tape Dental Injury: Teeth and Oropharynx as per pre-operative assessment

## 2024-01-07 NOTE — Interval H&P Note (Signed)
 History and Physical Interval Note:  01/07/2024 7:23 AM  Tanya Perkins  has presented today for surgery, with the diagnosis of ADNEXAL MASS.  The various methods of treatment have been discussed with the patient and family. After consideration of risks, benefits and other options for treatment, the patient has consented to  Procedure(s) with comments: SALPINGECTOMY, ROBOT-ASSISTED (Bilateral) - POSSIBLE UNILATERAL VERSUS BILATERAL OOPHORECTOMY, POSSIBLE STAGING HYSTERECTOMY, TOTAL, ROBOT-ASSISTED (N/A) WIDE EXCISION VULVECTOMY (N/A) - VULVAR LESION EXCISION as a surgical intervention.  The patient's history has been reviewed, patient examined, no change in status, stable for surgery.  I have reviewed the patient's chart and labs.  Questions were answered to the patient's satisfaction.    Plan discussed this morning that is that if the mass is a fibroid, as along as both ovaries normal in appearance, will plan on TRH/BS. If mass is one of the ovaries, plan to leave the uterus in situ and the contralateral ovary as long as no borderline or malignant findings on frozen section.    Comer JONELLE Dollar

## 2024-01-07 NOTE — Transfer of Care (Signed)
 Immediate Anesthesia Transfer of Care Note  Patient: Tanya Perkins  Procedure(s) Performed: ROBOTIC LAPAROSCOPIC UNILATERAL RIGHT OOPHORECTOMY, BILATERAL SALPINGECTOMY, MINI LAPAROTOMY FOR SPECIMEN REMOVAL (Bilateral) WIDE EXCISION VULVECTOMY  Patient Location: PACU  Anesthesia Type:General  Level of Consciousness: drowsy and patient cooperative  Airway & Oxygen Therapy: Patient Spontanous Breathing and Patient connected to face mask oxygen  Post-op Assessment: Report given to RN and Post -op Vital signs reviewed and stable  Post vital signs: Reviewed and stable  Last Vitals:  Vitals Value Taken Time  BP 115/84 01/07/24 11:00  Temp 36.6 C 01/07/24 11:00  Pulse 59 01/07/24 11:03  Resp 12 01/07/24 11:03  SpO2 100 % 01/07/24 11:03  Vitals shown include unfiled device data.  Last Pain:  Vitals:   01/07/24 1100  TempSrc:   PainSc: 8       Patients Stated Pain Goal: 5 (01/07/24 0600)  Complications: No notable events documented.

## 2024-01-07 NOTE — Op Note (Signed)
 OPERATIVE NOTE  Pre-operative Diagnosis: Adnexal mass, vulvar lesion  Post-operative Diagnosis: same as above  Operation: Robotic-assisted laparoscopic bilateral salpingectomy, right oophorectomy, left ovarian cystectomy, mini-lap for specimen removal, vulvar biopsy/excision  Surgeon: Viktoria Crank MD  Assistant Surgeon: Eleanor Epps, NP (an NP assistant was necessary for tissue manipulation, management of robotic instrumentation, retraction and positioning due to the complexity of the case and hospital policies).   Anesthesia: GET  Urine Output: 150 cc  Operative Findings: On EUA, 1 cm bullous lesion on the anterior vulva that looks consistent with a hemangioma. ON EUA, mobile uterus, mass appreciated in the right adnexa. On intra-abdominal entry, normal upper abdominal survey. Normal omentum, small and large bowel, appendix. NO ascites. Right ovary replaced by 6-8 cm mostly solid appearing mass, smooth. Normal appearing bilateral fallopian tubes. Left ovary with 2 1-2 cm cysts, otherwise normal in appearance. Uterus 8-10 cm, normal. Right solid mass was morcellated in contained fashion within an Endocatch bag after extending RUQ incision approximately 1 cm (this took approximately 60 minutes). 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  Estimated Blood Loss:  50 cc      Total IV Fluids: see I&O flowsheet         Specimens: bilateral tubes, right ovary, left ovarian cyst x 2, pelvic washings, anterior vulvar lesion         Complications:  None apparent; patient tolerated the procedure well.         Disposition: PACU - hemodynamically stable.  Procedure Details  The patient was seen in the Holding Room. The risks, benefits, complications, treatment options, and expected outcomes were discussed with the patient.  The patient concurred with the proposed plan, giving informed consent.  The site of surgery properly noted/marked. The  patient was identified as Tanya Perkins and the procedure verified as a Robotic-assisted bilateral salpingectomy with unilateral versus bilateral oophorectomy, possible total hysterectomy, vulvar excision, and any other indicated procedures.   After induction of anesthesia, the patient was draped and prepped in the usual sterile manner. Patient was placed in supine position after anesthesia and draped and prepped in the usual sterile manner as follows: Her arms were tucked to her side with all appropriate precautions.  The shoulders were stabilized with padded shoulder blocks applied to the acromium processes.  The patient was placed in the semi-lithotomy position in [place] stirrups.  The perineum and vagina were prepped with CHG. The patient's abdomen was prepped with ChloraPrep and then she was draped after the prep had been allowed to dry for 3 minutes.  A Time Out was held and the above information confirmed.  The urethra was prepped with Betadine. Foley catheter was placed.  A sterile speculum was placed in the vagina.  The cervix was grasped with a single-tooth tenaculum. The cervix was dilated with Fredirick dilators.  The 3.5 Advincula uterine manipulator with a colpotomizer ring was placed without difficulty.  A pneumo-occluder balloon was placed over the manipulator.  OG tube placement was confirmed and to suction.   Next, a 10 mm skin incision was made 1 cm below the subcostal margin in the midclavicular line.  The 5 mm Optiview port and scope was used for direct entry.  Opening pressure was under 10 mm CO2.  The abdomen was insufflated and the findings were noted as above.   At this point and all points during the procedure, the patient's intra-abdominal pressure did not exceed 15 mmHg. Next, an 8 mm skin  incision was made superior to the umbilicus and a right and left port were placed about 8 cm lateral to the robot port on the right and left side.  A fourth arm was placed on the right.  The 5 mm  assist trocar was exchanged for a 12 mm airseal port. All ports were placed under direct visualization.  The patient was placed in steep Trendelenburg.  Bowel was folded away into the upper abdomen.  The robot was docked in the normal manner.  Pelvic washings were collected. One filmy adhesion noted between the omentum and the uterine fundus was cauterized and transected.  The right peritoneum was opened parallel to the IP ligament to open the retroperitoneal space. The round ligament was preserved. The ureter was noted to be on the medial leaf of the broad ligament.  The peritoneum above the ureter was incised and stretched and the infundibulopelvic ligament was skeletonized, cauterized and cut.  The utero-ovarian ligament and fallopian tube were skeletonized, cauterized and transected just lateral to the uterine fundus, freeing the adnexa. This was placed in an Endocatch bag.   The left fallopian tube was then elevated and using a combination of monopolar and bilateral electrocautery, the fallopian tube was cauterized and transected from the underlying ovary along the mesosalpinx. The tube was removed through the assist trocar. Cystectomy of the two larger cysts was then performed using blunt dissection and monopolar electrocautery. Once cyst walls had been removed, hemostasis was achieved with monopolar electrocautery. Both defects were closed with 2-0 Stratafix with excellent hemostasis noted.   Irrigation was used and excellent hemostasis was achieved.  Robotic instruments were removed under direct visulaization.  The robot was undocked.   The skin and fascial incision at the RUQ incision were extended 1 cm. Using Kochers and ringed forceps, the ovary was slowly removed in piecemeal fashion using morcellation techniques. Once approximately 1/3 of the ovary had been removed, it was sent for frozen section. Ultimately, the entire ovary was morcellated and removed and the bag confirmed to be intact.    The fascia at the RUQ incision was closed with 0 Vicryl in running fashion.  The subcutaneous tissue was irrigated and Exparel  was injected for local anesthesia. 2-0 Vicrl was then used to re-approximate the subcutaneous tissue.   The subcuticular tissue at all incisions was closed with 4-0 Vicryl and the skin was closed with 4-0 Monocryl in a subcuticular manner.  Dermabond was applied.    Attention was turned to the vulva. The subcuticular tissues around the anterior lesion were infiltrated with 0.25% marcaine . The 15 blade scalpel was used to make an incision through the skin circumferentially. The skin elipse was grasped and was separated from the underlying deep dermal tissues with the bovie device. The bovie was used to obtain hemostasis at the surgical bed. The subcutaneous tissues were irrigated and made hemostatic.   The deep dermal layer was approximated with 2-0 and 3-0 vicryl mattress sutures to bring the skin edges into approximation and off tension. The wound was closed following langher's lines. The cutaneous layer was closed with interrupted 4-0 vicryl stitches and mattress sutures to ensure a tension free and hemostatic closure. The perineum was again irrigated.   The manipulator was removed with minimal bleeding noted. Foley catheter was removed.  All sponge, lap and needle counts were correct x  3.   The patient was transferred to the recovery room in stable condition.  Comer Dollar, MD

## 2024-01-07 NOTE — Anesthesia Postprocedure Evaluation (Signed)
 Anesthesia Post Note  Patient: Tanya Perkins  Procedure(s) Performed: ROBOTIC LAPAROSCOPIC UNILATERAL RIGHT OOPHORECTOMY, BILATERAL SALPINGECTOMY, MINI LAPAROTOMY FOR SPECIMEN REMOVAL (Bilateral) WIDE EXCISION VULVECTOMY     Patient location during evaluation: PACU Anesthesia Type: General Level of consciousness: awake and alert Pain management: pain level controlled Vital Signs Assessment: post-procedure vital signs reviewed and stable Respiratory status: spontaneous breathing, nonlabored ventilation and respiratory function stable Cardiovascular status: blood pressure returned to baseline Postop Assessment: no apparent nausea or vomiting Anesthetic complications: no   No notable events documented.  Last Vitals:  Vitals:   01/07/24 1130 01/07/24 1145  BP: 111/63 110/65  Pulse: 64 77  Resp: 13 12  Temp:  36.6 C  SpO2: 100% 96%    Last Pain:  Vitals:   01/07/24 1145  TempSrc:   PainSc: Asleep                 Vertell Row

## 2024-01-07 NOTE — Telephone Encounter (Signed)
 Cherice from [facility] called and stated that the patient is cleared for surgery

## 2024-01-08 ENCOUNTER — Telehealth: Payer: Self-pay | Admitting: *Deleted

## 2024-01-08 ENCOUNTER — Encounter (HOSPITAL_COMMUNITY): Payer: Self-pay | Admitting: Gynecologic Oncology

## 2024-01-08 NOTE — Telephone Encounter (Signed)
 Spoke with Tanya Perkins this morning. She states she is eating, drinking and urinating well. She has not had a BM yet but is passing gas. She is taking senokot as prescribed and encouraged her to drink plenty of water . She denies fever or chills. Incisions are dry and intact. She rates her pain 1/10. Her pain is controlled with hydrocodone .    Instructed to call office with any fever, chills, purulent drainage, uncontrolled pain or any other questions or concerns. Patient verbalizes understanding.   Pt aware of post op appointments as well as the office number [phone] and after hours number [phone] to call if she has any questions or concerns

## 2024-01-11 ENCOUNTER — Ambulatory Visit: Payer: Self-pay | Admitting: Gynecologic Oncology

## 2024-01-11 LAB — CYTOLOGY - NON PAP

## 2024-01-11 LAB — SURGICAL PATHOLOGY

## 2024-01-14 ENCOUNTER — Telehealth: Payer: Self-pay

## 2024-01-14 NOTE — Telephone Encounter (Signed)
 Office received FMLA forms for pt's surgery on 01/07/24 with Dr.Tucker. Requested information provided and faxed. Copy also emailed to patient.

## 2024-01-18 ENCOUNTER — Telehealth: Payer: Self-pay | Admitting: *Deleted

## 2024-01-18 NOTE — Telephone Encounter (Signed)
 Per provider moved appt on 10/3 from 8:45 am to 8:00 am

## 2024-01-19 ENCOUNTER — Ambulatory Visit: Admitting: Physician Assistant

## 2024-02-02 DIAGNOSIS — I10 Essential (primary) hypertension: Secondary | ICD-10-CM | POA: Diagnosis not present

## 2024-02-02 DIAGNOSIS — D631 Anemia in chronic kidney disease: Secondary | ICD-10-CM | POA: Diagnosis not present

## 2024-02-02 DIAGNOSIS — N02B9 Other recurrent and persistent immunoglobulin A nephropathy: Secondary | ICD-10-CM | POA: Diagnosis not present

## 2024-02-02 DIAGNOSIS — N1832 Chronic kidney disease, stage 3b: Secondary | ICD-10-CM | POA: Diagnosis not present

## 2024-02-05 ENCOUNTER — Encounter: Payer: Self-pay | Admitting: Gynecologic Oncology

## 2024-02-05 ENCOUNTER — Inpatient Hospital Stay: Attending: Gynecologic Oncology | Admitting: Gynecologic Oncology

## 2024-02-05 VITALS — BP 129/93 | HR 85 | Temp 98.6°F | Resp 16 | Wt 171.0 lb

## 2024-02-05 DIAGNOSIS — D1801 Hemangioma of skin and subcutaneous tissue: Secondary | ICD-10-CM

## 2024-02-05 DIAGNOSIS — D270 Benign neoplasm of right ovary: Secondary | ICD-10-CM

## 2024-02-05 DIAGNOSIS — N9089 Other specified noninflammatory disorders of vulva and perineum: Secondary | ICD-10-CM

## 2024-02-05 DIAGNOSIS — Z90721 Acquired absence of ovaries, unilateral: Secondary | ICD-10-CM

## 2024-02-05 DIAGNOSIS — D271 Benign neoplasm of left ovary: Secondary | ICD-10-CM

## 2024-02-05 DIAGNOSIS — Z9079 Acquired absence of other genital organ(s): Secondary | ICD-10-CM

## 2024-02-05 DIAGNOSIS — N9489 Other specified conditions associated with female genital organs and menstrual cycle: Secondary | ICD-10-CM

## 2024-02-05 NOTE — Progress Notes (Signed)
 Gynecologic Oncology Return Clinic Visit  02/05/24  Reason for Visit: follow-up  Treatment History: The patient was seen in the emergency department in mid July with right abdominal and flank pain with associated nausea and emesis.  CT of the abdomen and pelvis showed a 7.8 x 13 cm right adnexal mass, mostly solid in appearance.  This had increased in size since prior imaging in 2023 where there was an adnexal mass measuring up to 6.4 cm.  No free fluid, adenopathy, or other findings concerning for metastatic disease noted.  Patient was noted to have a white blood cell count of 22,000.  Creatinine was 2.1 at that time.   Pelvic MRI was performed on 7/27 and showed uterine adenomyosis with multiple small fibroids.  A large, T1 and T2 indeterminant, homogenously contrast-enhancing mass noted to be inseparable from the right ovary but clearly distinct from the uterine fundus measures 8.5 x 6.4 x 7.7 meters.  Differential includes ovarian solid tumor such as a fibrothecoma or a broad ligament fibroid.  No evidence of metastatic disease.   11/24/23: CA-125 84.9.  01/07/24: Robotic-assisted laparoscopic bilateral salpingectomy, right oophorectomy, left ovarian cystectomy, mini-lap for specimen removal, vulvar biopsy/excision   Interval History: Doing well.  Denies any significant pain at incisions.  Reports good bowel function.  Past Medical/Surgical History: Past Medical History:  Diagnosis Date   CKD stage 3b, GFR 30-44 ml/min (HCC)    Fibroid    uterine   History of chicken pox    Hypertension    Thyroid  disease    Thyroid  mass 2011   ONCOCYTIC ADENOMA--NON MALIGNANT    Past Surgical History:  Procedure Laterality Date   MYOMECTOMY ABDOMINAL APPROACH  05/2007   ROBOTIC ASSISTED BILATERAL SALPINGO OOPHERECTOMY Bilateral 01/07/2024   Procedure: ROBOTIC LAPAROSCOPIC UNILATERAL RIGHT OOPHORECTOMY, BILATERAL SALPINGECTOMY, MINI LAPAROTOMY FOR SPECIMEN REMOVAL;  Surgeon: Viktoria Comer SAUNDERS, MD;   Location: [facility];  Service: Gynecology;  Laterality: Bilateral;  ROBOTIC LAPAROSCOPIC UNILATERAL RIGHT OOPHORECTOMY, BILATERAL SALPINGECTOMY, MINI LAPAROTOMY FOR SPECIMEN REMOVAL   THYROID  LOBECTOMY     LEFT - benign tumor   Tooth #20 implant     VULVECTOMY N/A 01/07/2024   Procedure: WIDE EXCISION VULVECTOMY;  Surgeon: Viktoria Comer SAUNDERS, MD;  Location: [facility];  Service: Gynecology;  Laterality: N/A;  VULVAR LESION EXCISION    Family History  Problem Relation Age of Onset   Thyroid  disease Mother        ?due to accident   Hypertension Father    Hyperlipidemia Father    Heart attack Father    Hypertension Brother    Hyperlipidemia Brother    Diabetes Maternal Aunt    Hypertension Maternal Aunt    Diabetes Maternal Uncle    Hypertension Maternal Uncle    Hypertension Paternal Aunt    Hypertension Paternal Uncle    Diabetes Maternal Grandmother    Stroke Maternal Grandmother    Leukemia Maternal Grandfather    Cancer Maternal Grandfather        lung   Hypertension Maternal Grandfather    Breast cancer Paternal Grandmother        Age 16's   Stroke Paternal Grandfather    Hypertension Paternal Grandfather    Diabetes Cousin    Asthma Cousin     Social History   Socioeconomic History   Marital status: Single    Spouse name: Not on file   Number of children: Not on file   Years of education: Not on file   Highest education level:  Not on file  Occupational History   Not on file  Tobacco Use   Smoking status: Former    Passive exposure: Never   Smokeless tobacco: Never  Vaping Use   Vaping status: Never Used  Substance and Sexual Activity   Alcohol use: Never   Drug use: No   Sexual activity: Not Currently    Partners: Male    Birth control/protection: None  Other Topics Concern   Not on file  Social History Narrative   1 son 2003- Katr[place]a, mother is [place]   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, multimedia   Single   Enjoys sleeping, watching  her son's baseball   Social Drivers of Corporate investment banker Strain: Not on file  Food Insecurity: No Food Insecurity (06/30/2023)   Hunger Vital Sign    Worried About Running Out of Food in the Last Year: Never true    Ran Out of Food in the Last Year: Never true  Transportation Needs: No Transportation Needs (06/30/2023)   PRAPARE - Administrator, Civil Service (Medical): No    Lack of Transportation (Non-Medical): No  Physical Activity: Not on file  Stress: Not on file  Social Connections: Moderately Integrated (06/30/2023)   Social Connection and Isolation Panel    Frequency of Communication with Friends and Family: More than three times a week    Frequency of Social Gatherings with Friends and Family: More than three times a week    Attends Religious Services: More than 4 times per year    Active Member of [employer] or Organizations: Yes    Attends Banker Meetings: 1 to 4 times per year    Marital Status: Never married    Current Medications:  Current Outpatient Medications:    acetaminophen  (TYLENOL ) 500 MG tablet, Take 500-1,000 mg by mouth every 6 (six) hours as needed for moderate pain (pain score 4-6)., Disp: , Rfl:    amLODipine  (NORVASC ) 5 MG tablet, Take 5 mg by mouth daily., Disp: , Rfl:    carvedilol  (COREG ) 6.25 MG tablet, Take 6.25 mg by mouth 2 (two) times daily with a meal., Disp: , Rfl:    losartan (COZAAR) 50 MG tablet, Take 50 mg by mouth at bedtime., Disp: , Rfl:    Multiple Vitamins-Minerals (MULTIVITAMIN ADULTS PO), Take 1 tablet by mouth daily., Disp: , Rfl:    TARPEYO 4 MG CPDR, Take 8 mg by mouth 2 (two) times daily., Disp: , Rfl:    FARXIGA 10 MG TABS tablet, Take 10 mg by mouth daily. (Patient not taking: Reported on 02/05/2024), Disp: , Rfl:   Review of Systems: Denies appetite changes, fevers, chills, fatigue, unexplained weight changes. Denies hearing loss, neck lumps or masses, mouth sores, ringing in ears or voice  changes. Denies cough or wheezing.  Denies shortness of breath. Denies chest pain or palpitations. Denies leg swelling. Denies abdominal distention, pain, blood in stools, constipation, diarrhea, nausea, vomiting, or early satiety. Denies pain with intercourse, dysuria, frequency, hematuria or incontinence. Denies hot flashes, pelvic pain, vaginal bleeding or vaginal discharge.   Denies joint pain, back pain or muscle pain/cramps. Denies itching, rash, or wounds. Denies dizziness, headaches, numbness or seizures. Denies swollen lymph nodes or glands, denies easy bruising or bleeding. Denies anxiety, depression, confusion, or decreased concentration.  Physical Exam: BP (!) 129/93 (BP Location: Left Arm, Patient Position: Sitting)   Pulse 85   Temp 98.6 F (37 C) (Oral)   Resp  16   Wt 171 lb (77.6 kg)   LMP 12/19/2023 (Approximate) Comment: POC preg test (-) negative on 01/07/24  SpO2 100%   BMI 30.29 kg/m  General: Alert, oriented, no acute distress. HEENT: Posterior oropharynx clear, sclera anicteric. Chest: Unlabored breathing on room air. Abdomen: soft, nontender.  No masses or hepatosplenomegaly appreciated.  Well-healed incisions. Extremities: Grossly normal range of motion.  Warm, well perfused.  No edema bilaterally.  Laboratory & Radiologic Studies: A. FALLOPIAN TUBE, OVARY, PORTION OF RIGHT, SALPINGO OOPHORECTOMY:  - Ovary: leiomyoma. See Comment.  - Fallopian Tube: No significant pathologic changes   B. REMAINING FALLOPIAN TUBE, OVARY, PORTION OF RIGHT, SALPINGO  OOPHORECTOMY:  - Ovarian leiomyoma. See Comment.   C. FALLOPIAN TUBE, LEFT, SALPINGO OOPHORECTOMY:  - Complete cross-section of benign fallopian tube.   D. OVARY CYST, LEFT, SALPINGO OOPHORECTOMY:  - Benign serous cystadenoma   E. VULVAR, ANTERIOR, EXCISION:  - Hemangioma   COMMENT:  Immunohistochemical stains reveal the right ovarian spindle cell lesion  is positive for SMA and desmin, and negative  for WT1, and inhibin,  consistent with leiomyoma.   Cytology: benign cells  Assessment & Plan: Tanya Perkins is a 43 y.o. woman s/p robotic BS, RO and left ovarian cystectomy, mini-lap for specimen removal, vulvar biopsy. Pathology all benign.  Doing well postoperatively.  Discussed continued expectations and restrictions.  Pathology reviewed from surgery.  She was given a copy of her pathology report.  She will be discharged back to her other medical providers.  12 minutes of total time was spent for this patient encounter, including preparation, face-to-face counseling with the patient and coordination of care, and documentation of the encounter.  Comer Dollar, MD  Division of Gynecologic Oncology  Department of Obstetrics and Gynecology  [facility]

## 2024-02-12 ENCOUNTER — Ambulatory Visit: Admitting: Physician Assistant

## 2024-02-16 ENCOUNTER — Ambulatory Visit: Admitting: Physician Assistant

## 2024-02-25 ENCOUNTER — Ambulatory Visit: Admitting: Physician Assistant

## 2024-03-15 ENCOUNTER — Other Ambulatory Visit: Payer: Self-pay | Admitting: Physician Assistant

## 2024-03-15 ENCOUNTER — Telehealth: Payer: Self-pay

## 2024-03-15 ENCOUNTER — Encounter: Payer: Self-pay | Admitting: Physician Assistant

## 2024-03-15 ENCOUNTER — Ambulatory Visit: Admitting: Physician Assistant

## 2024-03-15 VITALS — BP 142/102 | HR 75 | Temp 97.5°F | Ht 63.0 in | Wt 181.4 lb

## 2024-03-15 DIAGNOSIS — N6489 Other specified disorders of breast: Secondary | ICD-10-CM

## 2024-03-15 DIAGNOSIS — N289 Disorder of kidney and ureter, unspecified: Secondary | ICD-10-CM | POA: Diagnosis not present

## 2024-03-15 DIAGNOSIS — Z23 Encounter for immunization: Secondary | ICD-10-CM | POA: Diagnosis not present

## 2024-03-15 DIAGNOSIS — R928 Other abnormal and inconclusive findings on diagnostic imaging of breast: Secondary | ICD-10-CM

## 2024-03-15 DIAGNOSIS — I10 Essential (primary) hypertension: Secondary | ICD-10-CM | POA: Diagnosis not present

## 2024-03-15 DIAGNOSIS — K8020 Calculus of gallbladder without cholecystitis without obstruction: Secondary | ICD-10-CM

## 2024-03-15 NOTE — Progress Notes (Signed)
 Patient ID: Tanya Perkins, female    DOB: 26-Dec-1980, 43 y.o.   MRN: [id number]   Assessment & Plan:  Gallstones -     Ambulatory referral to General Surgery  Immunization due -     Flu vaccine trivalent PF, 6mos and older(Flulaval,Afluria,Fluarix,Fluzone)      Assessment and Plan Assessment & Plan       Return in about 6 months (around 09/12/2024) for recheck/follow-up.    Subjective:    Chief Complaint  Patient presents with   Hypertension    Pt in office for HTN follow up; pt wants to discuss gallstones and referral    HPI Discussed the use of AI scribe software for clinical note transcription with the patient, who gave verbal consent to proceed.  History of Present Illness      Past Medical History:  Diagnosis Date   CKD stage 3b, GFR 30-44 ml/min (HCC)    Fibroid    uterine   History of chicken pox    Hypertension    Thyroid  disease    Thyroid  mass 2011   ONCOCYTIC ADENOMA--NON MALIGNANT    Past Surgical History:  Procedure Laterality Date   MYOMECTOMY ABDOMINAL APPROACH  05/2007   ROBOTIC ASSISTED BILATERAL SALPINGO OOPHERECTOMY Bilateral 01/07/2024   Procedure: ROBOTIC LAPAROSCOPIC UNILATERAL RIGHT OOPHORECTOMY, BILATERAL SALPINGECTOMY, MINI LAPAROTOMY FOR SPECIMEN REMOVAL;  Surgeon: Viktoria Comer SAUNDERS, MD;  Location: [facility];  Service: Gynecology;  Laterality: Bilateral;  ROBOTIC LAPAROSCOPIC UNILATERAL RIGHT OOPHORECTOMY, BILATERAL SALPINGECTOMY, MINI LAPAROTOMY FOR SPECIMEN REMOVAL   THYROID  LOBECTOMY     LEFT - benign tumor   Tooth #20 implant     VULVECTOMY N/A 01/07/2024   Procedure: WIDE EXCISION VULVECTOMY;  Surgeon: Viktoria Comer SAUNDERS, MD;  Location: [facility];  Service: Gynecology;  Laterality: N/A;  VULVAR LESION EXCISION    Family History  Problem Relation Age of Onset   Thyroid  disease Mother        ?due to accident   Hypertension Father    Hyperlipidemia Father    Heart attack Father    Hypertension Brother     Hyperlipidemia Brother    Diabetes Maternal Grandmother    Stroke Maternal Grandmother    Leukemia Maternal Grandfather    Cancer Maternal Grandfather        lung   Hypertension Maternal Grandfather    Breast cancer Paternal Grandmother        Age 44's   Stroke Paternal Grandfather    Hypertension Paternal Grandfather    Diabetes Maternal Aunt    Hypertension Maternal Aunt    Diabetes Maternal Uncle    Hypertension Maternal Uncle    Hypertension Paternal Aunt    Hypertension Paternal Uncle    Diabetes Cousin    Asthma Cousin    Colon cancer Neg Hx     Social History   Tobacco Use   Smoking status: Former    Passive exposure: Never   Smokeless tobacco: Never  Vaping Use   Vaping status: Never Used  Substance Use Topics   Alcohol use: Never   Drug use: No     No Known Allergies  Review of Systems NEGATIVE UNLESS OTHERWISE INDICATED IN HPI      Objective:     BP (!) 142/102 (BP Location: Left Arm, Patient Position: Sitting, Cuff Size: Large)   Pulse 75   Temp (!) 97.5 F (36.4 C) (Temporal)   Ht 5' 3 (1.6 m)   Wt 181 lb 6.4 oz (82.3 kg)  LMP 03/01/2024 (Approximate)   SpO2 97%   BMI 32.13 kg/m   Wt Readings from Last 3 Encounters:  03/15/24 181 lb 6.4 oz (82.3 kg)  02/05/24 171 lb (77.6 kg)  01/07/24 173 lb 3.2 oz (78.6 kg)    BP Readings from Last 3 Encounters:  03/15/24 (!) 142/102  02/05/24 (!) 129/93  01/07/24 107/68     Physical Exam          Siah Steely M Hooper [place], PA-C

## 2024-03-15 NOTE — Telephone Encounter (Signed)
 Called pt to advise that we just co signed order for diagnostic mammogram and she should be receiving a call shortly to schedule. Pt advised she just scheduled the appt and was aware. Pt also verbalized understanding of no show/cancellation policy and instructions for appt.

## 2024-03-16 DIAGNOSIS — N289 Disorder of kidney and ureter, unspecified: Secondary | ICD-10-CM | POA: Insufficient documentation

## 2024-03-24 ENCOUNTER — Ambulatory Visit: Payer: Self-pay | Admitting: Physician Assistant

## 2024-03-24 ENCOUNTER — Ambulatory Visit
Admission: RE | Admit: 2024-03-24 | Discharge: 2024-03-24 | Disposition: A | Discharge status: Home / Self Care | Source: Ambulatory Visit | Attending: Physician Assistant | Admitting: Physician Assistant

## 2024-03-24 ENCOUNTER — Ambulatory Visit

## 2024-03-24 DIAGNOSIS — N6489 Other specified disorders of breast: Secondary | ICD-10-CM

## 2024-03-24 DIAGNOSIS — R928 Other abnormal and inconclusive findings on diagnostic imaging of breast: Secondary | ICD-10-CM | POA: Diagnosis not present

## 2024-03-29 DIAGNOSIS — N2581 Secondary hyperparathyroidism of renal origin: Secondary | ICD-10-CM | POA: Diagnosis not present

## 2024-03-29 DIAGNOSIS — N02B9 Other recurrent and persistent immunoglobulin A nephropathy: Secondary | ICD-10-CM | POA: Diagnosis not present

## 2024-03-29 DIAGNOSIS — N1832 Chronic kidney disease, stage 3b: Secondary | ICD-10-CM | POA: Diagnosis not present

## 2024-03-29 DIAGNOSIS — N184 Chronic kidney disease, stage 4 (severe): Secondary | ICD-10-CM | POA: Diagnosis not present

## 2024-03-29 DIAGNOSIS — N189 Chronic kidney disease, unspecified: Secondary | ICD-10-CM | POA: Diagnosis not present

## 2024-03-29 DIAGNOSIS — D631 Anemia in chronic kidney disease: Secondary | ICD-10-CM | POA: Diagnosis not present

## 2024-03-29 DIAGNOSIS — I10 Essential (primary) hypertension: Secondary | ICD-10-CM | POA: Diagnosis not present

## 2024-03-30 LAB — LAB REPORT - SCANNED
Creatinine, POC: 61.1 mg/dL
EGFR: 32

## 2024-04-14 DIAGNOSIS — K8020 Calculus of gallbladder without cholecystitis without obstruction: Secondary | ICD-10-CM | POA: Diagnosis not present

## 2024-04-14 NOTE — Progress Notes (Signed)
 "   REFERRING PHYSICIAN:  Allwardt, Mardy HERO  PROVIDER:  TODD OZELL SPINNER, MD  MRN: [id number] DOB: [birth year] DATE OF ENCOUNTER: 04/14/2024  Subjective   Chief Complaint: New Consultation (cholelithiasis)     History of Present Illness:  Patient is referred by her primary care provider for surgical evaluation and management of cholelithiasis.  Patient has undergone previous CT scanning for other indications.  This is noted a very large calcified laminated gallstone occupying approximately 60 to 70% of the gallbladder.  Patient has been asymptomatic.  She denies any history of jaundice or acholic stools.  She denies any abdominal pain.  She denies any food intolerance.  She has had no history of hepatitis or pancreatitis.  There is no family history of gallbladder disease.  Patient presents today to discuss options for management.   Review of Systems: A complete review of systems was obtained from the patient.  I have reviewed this information and discussed as appropriate with the patient.  See HPI as well for other ROS.  Review of Systems  Constitutional: Negative.   HENT: Negative.    Eyes: Negative.   Respiratory: Negative.    Cardiovascular: Negative.   Gastrointestinal: Negative.   Genitourinary: Negative.   Musculoskeletal: Negative.   Skin: Negative.   Neurological: Negative.   Endo/Heme/Allergies: Negative.   Psychiatric/Behavioral: Negative.        Medical History: Past Medical History:  Diagnosis Date   Chronic kidney disease    Hypertension     Patient Active Problem List  Diagnosis   Calculus of gallbladder without cholecystitis without obstruction    History reviewed. No pertinent surgical history.   No Known Allergies  Current Outpatient Medications on File Prior to Visit  Medication Sig Dispense Refill   acetaminophen  (TYLENOL ) 500 MG tablet Take 500-1,000 mg by mouth     amLODIPine  (NORVASC ) 5 MG tablet Take 5 mg by mouth once daily      carvediloL  (COREG ) 6.25 MG tablet TAKE 1 TABLET TWICE DAILY. NOTIFY THE RENAL CLINIC IF EXPERIENCING DIZZINESS AND/OR LIGHTHEADEDNESS.     TARPEYO 4 mg CpDR Take 8 mg by mouth     No current facility-administered medications on file prior to visit.    Family History  Problem Relation Age of Onset   Stroke Father    High blood pressure (Hypertension) Father    Hyperlipidemia (Elevated cholesterol) Brother      Social History   Tobacco Use  Smoking Status Former   Types: Cigarettes  Smokeless Tobacco Never     Social History   Socioeconomic History   Marital status: Single  Tobacco Use   Smoking status: Former    Types: Cigarettes   Smokeless tobacco: Never  Vaping Use   Vaping status: Never Used  Substance and Sexual Activity   Alcohol use: Yes    Alcohol/week: 1.0 - 2.0 standard drink of alcohol    Types: 1 - 2 Standard drinks or equivalent per week   Drug use: Never   Social Drivers of Health   Food Insecurity: No Food Insecurity (06/30/2023)   Received from [facility]   Hunger Vital Sign    Within the past 12 months, you worried that your food would run out before you got the money to buy more.: Never true    Within the past 12 months, the food you bought just didn't last and you didn't have money to get more.: Never true  Transportation Needs: No Transportation Needs (06/30/2023)   Received from  [place]   PRAPARE - Transportation    Lack of Transportation (Medical): No    Lack of Transportation (Non-Medical): No  Social Connections: Moderately Integrated (06/30/2023)   [facility] from [facility]   Social Connection and Isolation Panel    In a typical week, how many times do you talk on the phone with family, friends, or neighbors?: More than three times a week    How often do you get together with friends or relatives?: More than three times a week    How often do you attend church or religious services?: More than 4 times per year     Do you belong to any clubs or organizations such as church groups, unions, fraternal or athletic groups, or school groups?: Yes    How often do you attend meetings of the clubs or organizations you belong to?: 1 to 4 times per year    Are you married, widowed, divorced, separated, never married, or living with a partner?: Never married  Housing Stability: Unknown (04/14/2024)   Housing Stability Vital Sign    Homeless in the Last Year: No    Objective:    Vitals:   04/14/24 1354  BP: 135/89  Pulse: 71  Temp: 36.8 C (98.2 F)  SpO2: 97%  Weight: 84.2 kg (185 lb 9.6 oz)  Height: 160 cm (5' 3)  PainSc: 0-No pain    Body mass index is 32.88 kg/m.  Physical Exam   GENERAL APPEARANCE Comfortable, no acute issues Development: normal Gross deformities: none  SKIN Rash, lesions, ulcers: none Induration, erythema: none Nodules: none palpable  EYES Conjunctiva and lids: normal Pupils: equal  EARS, NOSE, MOUTH, THROAT External ears: no lesion or deformity External nose: no lesion or deformity Hearing: grossly normal  NECK Symmetric: yes Trachea: midline Thyroid : Well-healed anterior cervical incision with good cosmetic results  CHEST/CV Not assessed  ABDOMEN Soft without distention.  Well-healed surgical incisions consistent with previous laparoscopy.  No sign of hernia.  No hepatosplenomegaly.  No palpable masses.  No tenderness.  GENITOURINARY/RECTAL Not assessed  MUSCULOSKELETAL Station and gait: normal Digits and nails: no clubbing or cyanosis Muscle strength: grossly normal all extremities Deformity: none  PSYCHIATRIC Oriented to person, place, and time: yes Mood and affect: normal for situation Judgment and insight: appropriate for situation    Assessment and Plan:  Diagnoses and all orders for this visit:  Calculus of gallbladder without cholecystitis without obstruction    Patient is referred by her primary care provider for surgical  evaluation and recommendations regarding management of asymptomatic cholelithiasis.  Patient is provided with written literature on gallbladder surgery to review at home.  Patient has a very large gallstone which is calcified and laminated and occupies about 60 to 70% of the gallbladder.  Today we reviewed options for management.  Patient is not a good candidate for dissolution therapy.  The only real option for management is cholecystectomy.  We discussed laparoscopic cholecystectomy in detail.  We discussed risk and benefits of the procedure including the risk of conversion to open surgery.  We discussed the hospital stay and the postoperative recovery.  We discussed the fact that there is no absolute indication for surgery at this time.  However, it is unlikely that her gallbladder function is anywhere near normal, and there is the possibility of complications at a later date.  After consideration, the patient would like to proceed with cholecystectomy later this spring.  We will contact her in March with our spring operating room  schedule and allow her to decide on a date for her procedure.  We discussed time out of work, being approximately 2 weeks.  The patient understands and agrees to proceed.  Krystal Spinner MD [facility] Surgery Office: [phone]      "

## 2024-04-27 ENCOUNTER — Encounter: Payer: Self-pay | Admitting: Physician Assistant

## 2024-04-29 NOTE — Telephone Encounter (Signed)
 Do not see future labs in chart. Please review and advise. Tks

## 2024-09-16 ENCOUNTER — Ambulatory Visit: Admitting: Physician Assistant

## 2024-09-22 ENCOUNTER — Ambulatory Visit (HOSPITAL_BASED_OUTPATIENT_CLINIC_OR_DEPARTMENT_OTHER): Admitting: Certified Nurse Midwife
